# Patient Record
Sex: Female | Born: 1935 | Race: White | Hispanic: No | Smoking: Never smoker
Health system: Southern US, Community
[De-identification: ages and names within clinical notes are randomized; demographics above are authoritative.]

## PROBLEM LIST (undated history)

## (undated) DIAGNOSIS — F0390 Unspecified dementia without behavioral disturbance: Secondary | ICD-10-CM

## (undated) DIAGNOSIS — R413 Other amnesia: Secondary | ICD-10-CM

## (undated) DIAGNOSIS — F419 Anxiety disorder, unspecified: Secondary | ICD-10-CM

## (undated) DIAGNOSIS — J45909 Unspecified asthma, uncomplicated: Secondary | ICD-10-CM

## (undated) DIAGNOSIS — E559 Vitamin D deficiency, unspecified: Secondary | ICD-10-CM

## (undated) DIAGNOSIS — M810 Age-related osteoporosis without current pathological fracture: Secondary | ICD-10-CM

## (undated) DIAGNOSIS — N183 Chronic kidney disease, stage 3 unspecified: Secondary | ICD-10-CM

## (undated) DIAGNOSIS — C439 Malignant melanoma of skin, unspecified: Secondary | ICD-10-CM

## (undated) DIAGNOSIS — R1310 Dysphagia, unspecified: Secondary | ICD-10-CM

## (undated) DIAGNOSIS — H9190 Unspecified hearing loss, unspecified ear: Secondary | ICD-10-CM

## (undated) DIAGNOSIS — I10 Essential (primary) hypertension: Secondary | ICD-10-CM

## (undated) DIAGNOSIS — G3184 Mild cognitive impairment, so stated: Secondary | ICD-10-CM

## (undated) DIAGNOSIS — E785 Hyperlipidemia, unspecified: Secondary | ICD-10-CM

## (undated) HISTORY — DX: Age-related osteoporosis without current pathological fracture: M81.0

## (undated) HISTORY — DX: Mild cognitive impairment of uncertain or unknown etiology: G31.84

## (undated) HISTORY — DX: Anxiety disorder, unspecified: F41.9

## (undated) HISTORY — DX: Hyperlipidemia, unspecified: E78.5

## (undated) HISTORY — DX: Unspecified hearing loss, unspecified ear: H91.90

## (undated) HISTORY — DX: Unspecified asthma, uncomplicated: J45.909

## (undated) HISTORY — PX: CATARACT EXTRACTION: SUR2

## (undated) HISTORY — DX: Unspecified dementia, unspecified severity, without behavioral disturbance, psychotic disturbance, mood disturbance, and anxiety: F03.90

## (undated) HISTORY — DX: Chronic kidney disease, stage 3 unspecified: N18.30

## (undated) HISTORY — DX: Vitamin D deficiency, unspecified: E55.9

## (undated) HISTORY — DX: Dysphagia, unspecified: R13.10

## (undated) HISTORY — PX: KNEE SURGERY: SHX244

## (undated) HISTORY — DX: Other amnesia: R41.3

## (undated) HISTORY — DX: Essential (primary) hypertension: I10

## (undated) HISTORY — PX: APPENDECTOMY: SHX54

## (undated) HISTORY — DX: Malignant melanoma of skin, unspecified: C43.9

---

## 1998-06-24 ENCOUNTER — Other Ambulatory Visit: Admission: RE | Admit: 1998-06-24 | Discharge: 1998-06-24 | Payer: Self-pay | Admitting: Family Medicine

## 1999-06-07 ENCOUNTER — Other Ambulatory Visit: Admission: RE | Admit: 1999-06-07 | Discharge: 1999-06-07 | Payer: Self-pay | Admitting: Family Medicine

## 1999-06-30 ENCOUNTER — Encounter: Admission: RE | Admit: 1999-06-30 | Discharge: 1999-06-30 | Payer: Self-pay | Admitting: Family Medicine

## 1999-06-30 ENCOUNTER — Encounter: Payer: Self-pay | Admitting: Family Medicine

## 2000-08-08 ENCOUNTER — Encounter: Payer: Self-pay | Admitting: Family Medicine

## 2000-08-08 ENCOUNTER — Encounter: Admission: RE | Admit: 2000-08-08 | Discharge: 2000-08-08 | Payer: Self-pay | Admitting: Family Medicine

## 2001-08-13 ENCOUNTER — Encounter: Admission: RE | Admit: 2001-08-13 | Discharge: 2001-08-13 | Payer: Self-pay | Admitting: Family Medicine

## 2001-08-13 ENCOUNTER — Encounter: Payer: Self-pay | Admitting: Family Medicine

## 2001-08-29 ENCOUNTER — Other Ambulatory Visit: Admission: RE | Admit: 2001-08-29 | Discharge: 2001-08-29 | Payer: Self-pay | Admitting: Family Medicine

## 2002-12-23 ENCOUNTER — Encounter: Payer: Self-pay | Admitting: Family Medicine

## 2002-12-23 ENCOUNTER — Encounter: Admission: RE | Admit: 2002-12-23 | Discharge: 2002-12-23 | Payer: Self-pay | Admitting: Family Medicine

## 2002-12-25 ENCOUNTER — Encounter: Payer: Self-pay | Admitting: Family Medicine

## 2002-12-25 ENCOUNTER — Encounter: Admission: RE | Admit: 2002-12-25 | Discharge: 2002-12-25 | Payer: Self-pay | Admitting: Family Medicine

## 2004-01-19 ENCOUNTER — Encounter: Admission: RE | Admit: 2004-01-19 | Discharge: 2004-01-19 | Payer: Self-pay | Admitting: Family Medicine

## 2005-01-20 ENCOUNTER — Encounter: Admission: RE | Admit: 2005-01-20 | Discharge: 2005-01-20 | Payer: Self-pay | Admitting: Family Medicine

## 2006-01-22 ENCOUNTER — Encounter: Admission: RE | Admit: 2006-01-22 | Discharge: 2006-01-22 | Payer: Self-pay | Admitting: Family Medicine

## 2007-02-12 ENCOUNTER — Encounter: Admission: RE | Admit: 2007-02-12 | Discharge: 2007-02-12 | Payer: Self-pay | Admitting: Family Medicine

## 2008-02-21 ENCOUNTER — Encounter: Admission: RE | Admit: 2008-02-21 | Discharge: 2008-02-21 | Payer: Self-pay | Admitting: Family Medicine

## 2008-08-13 ENCOUNTER — Encounter: Admission: RE | Admit: 2008-08-13 | Discharge: 2008-08-13 | Payer: Self-pay | Admitting: Family Medicine

## 2009-05-13 ENCOUNTER — Encounter: Admission: RE | Admit: 2009-05-13 | Discharge: 2009-05-13 | Payer: Self-pay | Admitting: Family Medicine

## 2009-11-27 ENCOUNTER — Emergency Department (HOSPITAL_COMMUNITY): Admission: EM | Admit: 2009-11-27 | Discharge: 2009-11-27 | Payer: Self-pay | Admitting: Emergency Medicine

## 2011-06-07 ENCOUNTER — Other Ambulatory Visit: Payer: Self-pay | Admitting: Family Medicine

## 2011-06-07 ENCOUNTER — Ambulatory Visit
Admission: RE | Admit: 2011-06-07 | Discharge: 2011-06-07 | Disposition: A | Discharge status: Home / Self Care | Payer: Medicare Other | Source: Ambulatory Visit | Attending: Family Medicine | Admitting: Family Medicine

## 2011-06-07 DIAGNOSIS — J209 Acute bronchitis, unspecified: Secondary | ICD-10-CM

## 2011-07-10 ENCOUNTER — Other Ambulatory Visit: Payer: Self-pay | Admitting: Family Medicine

## 2011-07-10 ENCOUNTER — Ambulatory Visit
Admission: RE | Admit: 2011-07-10 | Discharge: 2011-07-10 | Disposition: A | Discharge status: Home / Self Care | Payer: Medicare Other | Source: Ambulatory Visit | Attending: Family Medicine | Admitting: Family Medicine

## 2011-07-10 DIAGNOSIS — W19XXXA Unspecified fall, initial encounter: Secondary | ICD-10-CM

## 2011-07-10 DIAGNOSIS — M25442 Effusion, left hand: Secondary | ICD-10-CM

## 2011-07-10 DIAGNOSIS — S60222A Contusion of left hand, initial encounter: Secondary | ICD-10-CM

## 2012-08-01 ENCOUNTER — Ambulatory Visit
Admission: RE | Admit: 2012-08-01 | Discharge: 2012-08-01 | Disposition: A | Discharge status: Home / Self Care | Payer: Medicare Other | Source: Ambulatory Visit | Attending: Family Medicine | Admitting: Family Medicine

## 2012-08-01 ENCOUNTER — Other Ambulatory Visit: Payer: Self-pay | Admitting: Family Medicine

## 2012-08-01 DIAGNOSIS — R0602 Shortness of breath: Secondary | ICD-10-CM

## 2014-04-07 ENCOUNTER — Other Ambulatory Visit: Payer: Self-pay

## 2014-04-07 ENCOUNTER — Other Ambulatory Visit: Payer: Self-pay | Admitting: Family Medicine

## 2014-04-07 DIAGNOSIS — Z1231 Encounter for screening mammogram for malignant neoplasm of breast: Secondary | ICD-10-CM

## 2014-04-16 ENCOUNTER — Ambulatory Visit
Admission: RE | Admit: 2014-04-16 | Discharge: 2014-04-16 | Disposition: A | Discharge status: Home / Self Care | Payer: Medicare Other | Source: Ambulatory Visit | Attending: Family Medicine | Admitting: Family Medicine

## 2014-04-16 DIAGNOSIS — Z1231 Encounter for screening mammogram for malignant neoplasm of breast: Secondary | ICD-10-CM

## 2014-07-21 ENCOUNTER — Encounter: Payer: Self-pay | Admitting: Neurology

## 2014-07-21 ENCOUNTER — Ambulatory Visit (INDEPENDENT_AMBULATORY_CARE_PROVIDER_SITE_OTHER): Payer: Medicare Other | Admitting: Neurology

## 2014-07-21 ENCOUNTER — Telehealth: Payer: Self-pay | Admitting: Family Medicine

## 2014-07-21 VITALS — BP 120/72 | HR 74 | Resp 16 | Wt 126.0 lb

## 2014-07-21 DIAGNOSIS — E785 Hyperlipidemia, unspecified: Secondary | ICD-10-CM | POA: Diagnosis not present

## 2014-07-21 DIAGNOSIS — F329 Major depressive disorder, single episode, unspecified: Secondary | ICD-10-CM

## 2014-07-21 DIAGNOSIS — R413 Other amnesia: Secondary | ICD-10-CM | POA: Insufficient documentation

## 2014-07-21 DIAGNOSIS — I10 Essential (primary) hypertension: Secondary | ICD-10-CM

## 2014-07-21 DIAGNOSIS — F32A Depression, unspecified: Secondary | ICD-10-CM

## 2014-07-21 MED ORDER — DONEPEZIL HCL 5 MG PO TABS: ORAL_TABLET | ORAL | Status: DC

## 2014-07-21 NOTE — Patient Instructions (Addendum)
1. Schedule MRI brain without contrast 2. We will obtain bloodwork done by your PCP, if TSH and vitamin B12 not done, we will order that 3. Start Aricept 5mg  daily 4. Physical exercise and brain stimulation exercises are important for brain health 5. Follow-up in 3 months

## 2014-07-21 NOTE — Telephone Encounter (Signed)
Called patient to give her appt info for her MRI Brain. She is scheduled at [facility] on 07/28/12 @ 1:00 pm to arrive at 12:45. Scheduling number given to patient in case she needs to change her appt.

## 2014-07-21 NOTE — Progress Notes (Signed)
NEUROLOGY CONSULTATION NOTE  Kristen Mclaughlin MRN: [id number] DOB: 1932/06/15  Referring provider: Dr. Harlan Stains Primary care provider:  Dr. Harlan Stains  Reason for consult:  Memory issues, r/o dementia  Dear Dr Dema Severin:  Thank you for your kind referral of Kristen Mclaughlin for consultation of the above symptoms. Although her history is well known to you, please allow me to reiterate it for the purpose of our medical record. She is by herself in the office today. Records and images were personally reviewed where available.  HISTORY OF PRESENT ILLNESS: This is a 81 year old right-handed woman with a history of hypertension, hyperlipidemia, depression, presenting with worsening memory loss over the past year. She reports "I'm thinking it's leaving me." She forgets names, conversations, misplaces things frequently. She lives with her husband who has noticed the same things. She has gotten lost driving in an unfamiliar place, and has had to call her daughter a couple of times in the past year. She occasionally forgets to take her medications. She has occasional word-finding difficulties. She denies any missed bill payments, no difficulties with ADLs. She endorses going through a lot, with stress, depression, and anxiety.  She has frontal headaches attributed to tension occurring around once a week, no associated nausea, vomiting, photo/phonophobia. No dizziness, blurred/double vision, no dysarthria/dysphagia, focal numbness/tingling/weakness, no tremors, bowel/bladder dysfunction. She has chronic back pain. She reports she has never been able to smell good. Her mother and sister have a diagnosis of Alzheimer's disease. She denies any history of head injuries, no alcohol use. She had Bell's palsy twice, she cannot recall which side, but thinks it is on the left side).  Laboratory Data: 03/2014: BMP normal, vitamin D 48.5  PAST MEDICAL HISTORY: Past Medical History  Diagnosis Date  .  Hypertension   . Hyperlipidemia   . Asthma     PAST SURGICAL HISTORY: Past Surgical History  Procedure Laterality Date  . Knee surgery      right    MEDICATIONS: No current outpatient prescriptions on file prior to visit.   No current facility-administered medications on file prior to visit.    ALLERGIES: No Known Allergies  FAMILY HISTORY: Family History  Problem Relation Age of Onset  . Alzheimer's disease Mother   . Alzheimer's disease Sister   . Asthma Sister   . Asthma Mother     SOCIAL HISTORY: History   Social History  . Marital Status: Married    Spouse Name: N/A  . Number of Children: 2  . Years of Education: N/A   Occupational History  . Retired    Social History Main Topics  . Smoking status: Never Smoker   . Smokeless tobacco: Never Used  . Alcohol Use: No  . Drug Use: No  . Sexual Activity: Not on file   Other Topics Concern  . Not on file   Social History Narrative    REVIEW OF SYSTEMS: Constitutional: No fevers, chills, or sweats, no generalized fatigue, change in appetite Eyes: No visual changes, double vision, eye pain Ear, nose and throat: No hearing loss, ear pain, nasal congestion, sore throat Cardiovascular: No chest pain, palpitations Respiratory:  No shortness of breath at rest or with exertion, wheezes GastrointestinaI: No nausea, vomiting, diarrhea, abdominal pain, fecal incontinence Genitourinary:  No dysuria, urinary retention or frequency Musculoskeletal:  No neck pain, back pain Integumentary: No rash, pruritus, skin lesions Neurological: as above Psychiatric: No depression, insomnia, anxiety Endocrine: No palpitations, fatigue, diaphoresis, mood swings, change in  appetite, change in weight, increased thirst Hematologic/Lymphatic:  No anemia, purpura, petechiae. Allergic/Immunologic: no itchy/runny eyes, nasal congestion, recent allergic reactions, rashes  PHYSICAL EXAM: Filed Vitals:   07/21/14 0859  BP: 120/72    Pulse: 74  Resp: 16   General: No acute distress Head:  Normocephalic/atraumatic Eyes: Fundoscopic exam shows bilateral sharp discs, no vessel changes, exudates, or hemorrhages Neck: supple, no paraspinal tenderness, full range of motion Back: No paraspinal tenderness Heart: regular rate and rhythm Lungs: Clear to auscultation bilaterally. Vascular: No carotid bruits. Skin/Extremities: No rash, no edema Neurological Exam: Mental status: alert and oriented to person, place, and time, no dysarthria or aphasia, Fund of knowledge is appropriate.  Recent and remote memory are intact.  Attention and concentration are normal.    Able to name objects and repeat phrases.  Montreal Cognitive Assessment  07/21/2014  Visuospatial/ Executive (0/5) 3  Naming (0/3) 3  Attention: Read list of digits (0/2) 1  Attention: Read list of letters (0/1) 1  Attention: Serial 7 subtraction starting at 100 (0/3) 3  Language: Repeat phrase (0/2) 2  Language : Fluency (0/1) 0  Abstraction (0/2) 2  Delayed Recall (0/5) 4  Orientation (0/6) 6  Total 25  Adjusted Score (based on education) 25   Cranial nerves: CN I: not tested CN II: pupils equal, round and reactive to light, visual fields intact, fundi unremarkable. CN III, IV, VI:  full range of motion, no nystagmus, no ptosis CN V: facial sensation intact CN VII: upper and lower face symmetric but note of decreased blink on the left with synkinesis CN VIII: hearing intact to finger rub CN IX, X: gag intact, uvula midline CN XI: sternocleidomastoid and trapezius muscles intact CN XII: tongue midline Bulk & Tone: normal, no fasciculations. Motor: 5/5 throughout with no pronator drift. Sensation: decreased vibration to ankles bilaterally, otherwise intact to light touch, cold, pin, and joint position sense.  No extinction to double simultaneous stimulation.  Romberg test negative Deep Tendon Reflexes: +2 throughout, no ankle clonus Plantar responses:  downgoing bilaterally Cerebellar: no incoordination on finger to nose, heel to shin. No dysdiadochokinesia Gait: narrow-based and steady, able to tandem walk adequately. Tremor: none  IMPRESSION: This is a 81 year old righ-handed woman with a history of hypertension, hyperlipidemia, depression, presenting with worsening memory over the past year. Her MOCA score today is 25/30, indicating mild cognitive impairment. We discussed different causes of memory loss. Check TSH and B12. MRI brain without contrast will be ordered to assess for underlying structural abnormality and assess vascular load. We also discussed effects of mood on memory, she endorses a lot of stress and anxiety. Continue to monitor. We discussed that he may benefit from starting cholinesterase inhibitors such as Aricept, side effects and expectations from the medication were discussed. She will start low dose Aricept 5 mg daily. We discussed the importance of control of vascular risk factors, physical exercise, and brain stimulation exercises for brain health. She will follow-up in 3 months.   Thank you for allowing me to participate in the care of this patient. Please do not hesitate to call for any questions or concerns.   Ellouise Newer, M.D.  CC: Dr. Dema Severin

## 2014-07-24 ENCOUNTER — Telehealth: Payer: Self-pay | Admitting: Family Medicine

## 2014-07-24 DIAGNOSIS — F32A Depression, unspecified: Secondary | ICD-10-CM | POA: Insufficient documentation

## 2014-07-24 DIAGNOSIS — E785 Hyperlipidemia, unspecified: Secondary | ICD-10-CM | POA: Insufficient documentation

## 2014-07-24 DIAGNOSIS — F329 Major depressive disorder, single episode, unspecified: Secondary | ICD-10-CM | POA: Insufficient documentation

## 2014-07-24 DIAGNOSIS — I10 Essential (primary) hypertension: Secondary | ICD-10-CM | POA: Insufficient documentation

## 2014-07-24 DIAGNOSIS — R413 Other amnesia: Secondary | ICD-10-CM

## 2014-07-24 NOTE — Telephone Encounter (Signed)
Called patient and notified of below. She will come to have labs done sometime next week. Orders put in Epic and printed left up front for p/u to take to [facility].

## 2014-07-24 NOTE — Telephone Encounter (Signed)
-----   Message from Cameron Sprang, MD sent at 07/24/2014  3:29 PM EDT ----- Regarding: labs Pls let her know we received bloodwork from Dr. Dema Severin, would do TSH and B12, pls send lab slip. Thanks

## 2014-07-28 LAB — VITAMIN B12: Vitamin B-12: 346 pg/mL (ref 211–911)

## 2014-07-28 LAB — TSH: TSH: 2.088 u[IU]/mL (ref 0.350–4.500)

## 2014-07-29 ENCOUNTER — Ambulatory Visit (HOSPITAL_COMMUNITY)
Admission: RE | Admit: 2014-07-29 | Discharge: 2014-07-29 | Disposition: A | Discharge status: Home / Self Care | Payer: Medicare Other | Source: Ambulatory Visit | Attending: Neurology | Admitting: Neurology

## 2014-07-29 DIAGNOSIS — R413 Other amnesia: Secondary | ICD-10-CM | POA: Insufficient documentation

## 2014-07-30 ENCOUNTER — Telehealth: Payer: Self-pay | Admitting: Family Medicine

## 2014-07-30 NOTE — Telephone Encounter (Signed)
Patient notified of MRI & lab results.              Pls let her know bloodwork is normal, thanks

## 2014-07-30 NOTE — Telephone Encounter (Signed)
-----   Message from Cameron Sprang, MD sent at 07/29/2014  3:14 PM EDT ----- Pls let patient know I reviewed MRI brain, no evidence of tumor, stroke, or bleed. It shows age-related changes. Thanks

## 2014-10-20 ENCOUNTER — Ambulatory Visit: Payer: Medicare Other | Admitting: Neurology

## 2014-10-22 ENCOUNTER — Encounter: Payer: Self-pay | Admitting: *Deleted

## 2015-08-14 ENCOUNTER — Other Ambulatory Visit: Payer: Self-pay | Admitting: Neurology

## 2015-08-16 NOTE — Telephone Encounter (Signed)
Pt last seen 4/16. No refill to be given.

## 2015-11-01 ENCOUNTER — Encounter: Payer: Self-pay | Admitting: Neurology

## 2015-11-01 ENCOUNTER — Ambulatory Visit (INDEPENDENT_AMBULATORY_CARE_PROVIDER_SITE_OTHER): Payer: Medicare Other | Admitting: Neurology

## 2015-11-01 VITALS — BP 140/66 | HR 72 | Ht 61.0 in | Wt 131.0 lb

## 2015-11-01 DIAGNOSIS — G3184 Mild cognitive impairment, so stated: Secondary | ICD-10-CM | POA: Diagnosis not present

## 2015-11-01 MED ORDER — DONEPEZIL HCL 10 MG PO TABS: ORAL_TABLET | ORAL | 11 refills | Status: DC

## 2015-11-01 NOTE — Patient Instructions (Signed)
1. Start Aricept 10mg : Take 1/2 tablet daily for 1 month, then increase to 1 tablet daily 2. Discuss mood issues with PCP, consider seeing a psychiatrist and therapist 3. Physical exercise and brain stimulation exercises are important for brain health 4. Follow-up in 6 months, call for any changes

## 2015-11-01 NOTE — Progress Notes (Signed)
NEUROLOGY FOLLOW UP OFFICE NOTE  TAMICHA TOWSLEY PX:[id number]  HISTORY OF PRESENT ILLNESS: I had the pleasure of seeing Kristen Mclaughlin in follow-up in the neurology clinic on 11/01/2015.  The patient was last seen more than a year ago for mild cognitive impairment. She is accompanied by her daughter who helps supplement the history today.  MOCA score 25/30 on initial visit in April 2016. She was started on low dose Aricept 5mg  daily and was only taking this intermittently. She felt it was too strong or not working, unclear to determine, but essentially stopped the medication. She denied any clear side effects. I personally reviewed MRI brain without contrast done 07/2014 which did not show any acute changes, there was moderate atrophy and chronic microvascular disease. TSH and B12 normal.  Since her last visit, they report that she got lost driving twice. For the past couple of months, she has only been driving short distances locally without any difficulties. She lives with her husband and has missed some bill payments. She denies any missed medications. Her daughter notices she repeats herself. She would call her daughter reporting her phone is not ringing, not realizing she turned it on vibrate mode during church. She does this every week. They have noticed significant worsening in the past 4-5 months. She reports undergoing stress, anxiety, and depression, worse in the past few months. She denies any significant headaches, no dizziness, vision changes, focal numbness/tingling/weakness, bowel/bladder dysfunction. She has chronic back pain.   HPI April 2016: This is an 82 yo RH woman with a history of hypertension, hyperlipidemia, depression, with worsening memory loss over the past year. She reports "I'm thinking it's leaving me." She forgets names, conversations, misplaces things frequently. She lives with her husband who has noticed the same things. She has gotten lost driving in an unfamiliar place,  and has had to call her daughter a couple of times in the past year. She occasionally forgets to take her medications. She has occasional word-finding difficulties. She denies any missed bill payments, no difficulties with ADLs. She endorses going through a lot, with stress, depression, and anxiety.  Her mother and sister have a diagnosis of Alzheimer's disease. She denies any history of head injuries, no alcohol use. She had Bell's palsy twice, she cannot recall which side, but thinks it is on the left side).  PAST MEDICAL HISTORY: Past Medical History:  Diagnosis Date  . Asthma   . Hyperlipidemia   . Hypertension     MEDICATIONS: Current Outpatient Prescriptions on File Prior to Visit  Medication Sig Dispense Refill  . albuterol (PROVENTIL HFA;VENTOLIN HFA) 108 (90 BASE) MCG/ACT inhaler Inhale 1 puff into the lungs. As Needed    . aspirin 81 MG tablet Take 81 mg by mouth daily.    . Coenzyme Q10 (CO Q-10) 200 MG CAPS Take 200 mg by mouth daily.    . diclofenac sodium (VOLTAREN) 1 % GEL Apply topically. As Needed    . donepezil (ARICEPT) 5 MG tablet Take 1 tablet daily 30 tablet 6  . losartan-hydrochlorothiazide (HYZAAR) 100-25 MG per tablet Take 1 tablet by mouth daily.    . sertraline (ZOLOFT) 100 MG tablet Take 100 mg by mouth daily.    . simvastatin (ZOCOR) 40 MG tablet Take 40 mg by mouth daily.    . Vitamin D, Ergocalciferol, (DRISDOL) 50000 UNITS CAPS capsule Take 50,000 Units by mouth. 1 capsule 1-2 times a week    . vitamin E 400 UNIT capsule Take 400  Units by mouth daily.     No current facility-administered medications on file prior to visit.     ALLERGIES: No Known Allergies  FAMILY HISTORY: Family History  Problem Relation Age of Onset  . Alzheimer's disease Mother   . Alzheimer's disease Sister   . Asthma Sister   . Asthma Mother     SOCIAL HISTORY: Social History   Social History  . Marital status: Married    Spouse name: N/A  . Number of children: 2    . Years of education: N/A   Occupational History  . Retired    Social History Main Topics  . Smoking status: Never Smoker  . Smokeless tobacco: Never Used  . Alcohol use No  . Drug use: No  . Sexual activity: Not on file   Other Topics Concern  . Not on file   Social History Narrative  . No narrative on file    REVIEW OF SYSTEMS: Constitutional: No fevers, chills, or sweats, no generalized fatigue, change in appetite Eyes: No visual changes, double vision, eye pain Ear, nose and throat: No hearing loss, ear pain, nasal congestion, sore throat Cardiovascular: No chest pain, palpitations Respiratory:  No shortness of breath at rest or with exertion, wheezes GastrointestinaI: No nausea, vomiting, diarrhea, abdominal pain, fecal incontinence Genitourinary:  No dysuria, urinary retention or frequency Musculoskeletal:  No neck pain,+ back pain Integumentary: No rash, pruritus, skin lesions Neurological: as above Psychiatric: No depression, insomnia, anxiety Endocrine: No palpitations, fatigue, diaphoresis, mood swings, change in appetite, change in weight, increased thirst Hematologic/Lymphatic:  No anemia, purpura, petechiae. Allergic/Immunologic: no itchy/runny eyes, nasal congestion, recent allergic reactions, rashes  PHYSICAL EXAM: Vitals:   11/01/15 1543  BP: 140/66  Pulse: 72   General: No acute distress Head:  Normocephalic/atraumatic Neck: supple, no paraspinal tenderness, full range of motion Heart:  Regular rate and rhythm Lungs:  Clear to auscultation bilaterally Back: No paraspinal tenderness Skin/Extremities: No rash, no edema Neurological Exam: alert and oriented to person, place, and time. No aphasia or dysarthria. Fund of knowledge is appropriate.  Recent and remote memory are intact.  Attention and concentration are normal.    Able to name objects and repeat phrases.  Montreal Cognitive Assessment  11/01/2015 07/21/2014  Visuospatial/ Executive (0/5) 5 3   Naming (0/3) 3 3  Attention: Read list of digits (0/2) 2 1  Attention: Read list of letters (0/1) 1 1  Attention: Serial 7 subtraction starting at 100 (0/3) 3 3  Language: Repeat phrase (0/2) 2 2  Language : Fluency (0/1) 0 0  Abstraction (0/2) 2 2  Delayed Recall (0/5) 4 4  Orientation (0/6) 6 6  Total 28 25  Adjusted Score (based on education) 28 25   Cranial nerves: Pupils equal, round.  Extraocular movements intact with no nystagmus. No facial asymmetry. Motor:moves all extremities symmetrically.Gait narrow-based and steady.  IMPRESSION: This is a 82 yo RH woman with a history of hypertension, hyperlipidemia, depression, with worsening memory loss since her last visit. Her MOCA score today is better 28/30 (previously 25/30 in April 2016). We discussed resuming Aricept intake, she will start taking 5mg  daily for a month then increase to 10mg  daily. Side effects were discussed. We also discussed again effects of mood on memory, she endorses a lot of stress and anxiety, particularly in the past few months when memory symptoms worsened, and was encouraged to consider seeing Behavioral Medicine. We discussed the importance of control of vascular risk factors, physical exercise, and brain  stimulation exercises for brain health. We discussed driving, continue to monitor. If family continues to express concern, recommend a driving evaluation. She will follow-up in 6 months and knows to call for any changes.   Thank you for allowing me to participate in her care.  Please do not hesitate to call for any questions or concerns.  The duration of this appointment visit was 25 minutes of face-to-face time with the patient.  Greater than 50% of this time was spent in counseling, explanation of diagnosis, planning of further management, and coordination of care.   Ellouise Newer, M.D.   CC: Dr. Dema Severin

## 2015-12-17 ENCOUNTER — Telehealth: Payer: Self-pay | Admitting: Neurology

## 2015-12-17 NOTE — Telephone Encounter (Signed)
Have problems taking Donepezil.  Cause hic ups, acid reflex and constantly going to the bathroom (diarrhea) .  Please call pt, she would like to know if there is another med she could take without these side effects.

## 2015-12-17 NOTE — Telephone Encounter (Signed)
Any suggestions>?

## 2015-12-17 NOTE — Telephone Encounter (Signed)
Yes, stop the medication first and see how she does over the next 2 weeks. Call us back to make sure symptoms have gone away, then we will plan to start a different medication. Thanks

## 2015-12-17 NOTE — Telephone Encounter (Signed)
Pt notified, verbalized understanding.

## 2016-01-11 ENCOUNTER — Other Ambulatory Visit: Payer: Self-pay

## 2016-01-11 ENCOUNTER — Telehealth: Payer: Self-pay

## 2016-01-11 MED ORDER — RIVASTIGMINE TARTRATE 1.5 MG PO CAPS: ORAL_CAPSULE | ORAL | 3 refills | Status: DC

## 2016-01-11 NOTE — Telephone Encounter (Signed)
Patient called and states she had stopped taking Aricept and now wants to know if there is another medication she can try to take.

## 2016-01-11 NOTE — Telephone Encounter (Signed)
Start taking Exelon 1.5mg : Take 1 capsule at night for 1 week, then increase to 1 capsule twice a day. This is a very low dose, less likely to cause diarrhea. Call for any problems. Pls send for #30 with 3 refills, thanks

## 2016-01-11 NOTE — Telephone Encounter (Signed)
Left message with patient's husband of below and advised her to call if any questions or concerns.

## 2016-05-10 ENCOUNTER — Encounter: Payer: Self-pay | Admitting: Neurology

## 2016-05-10 ENCOUNTER — Ambulatory Visit (INDEPENDENT_AMBULATORY_CARE_PROVIDER_SITE_OTHER): Payer: Medicare Other | Admitting: Neurology

## 2016-05-10 VITALS — BP 140/82 | HR 83 | Ht 61.0 in | Wt 127.1 lb

## 2016-05-10 DIAGNOSIS — G3184 Mild cognitive impairment, so stated: Secondary | ICD-10-CM

## 2016-05-10 MED ORDER — RIVASTIGMINE TARTRATE 1.5 MG PO CAPS: ORAL_CAPSULE | ORAL | 6 refills | Status: DC

## 2016-05-10 NOTE — Patient Instructions (Signed)
1. Start Exelon 1.5mg : Take 1 capsule at night for 1 week, then increase to 1 capsule twice a day 2. Call our office in 3 months, as long as tolerating the medication, we will call in a higher dose 3. Control of blood pressure, cholesterol, as well as physical exercise and brain stimulation exercises are important for brain health 4. Follow-up in 6 months, call for any changes

## 2016-05-10 NOTE — Progress Notes (Signed)
NEUROLOGY FOLLOW UP OFFICE NOTE  CLEOPHA GEETER PX:[id number]  HISTORY OF PRESENT ILLNESS: I had the pleasure of seeing Kristen Mclaughlin in follow-up in the neurology clinic on 05/10/2016.  The patient was last seen 6 months ago for mild cognitive impairment. She is again accompanied by her daughter who helps supplement the history today.  MOCA score 28/30 in August 2017 (25/30 on initial visit in April 2016). She was started on Aricept but had diarrhea, her husband called to report this and she was switched to Exelon, but she presents today stating she had no knowledge that a different medication was called in several months ago. Her daughter felt that when she was on the Aricept, she was making sense of things better and that it was helping her. She was not asking the same questions repeatedly and she seemed to have more clarity. She could tell a difference when she stopped the medication. She drives only short distances now and states she is doing okay. She reports having headaches "all the time." She denies any falls but feels wobbly and has to hold on to things. She denies any focal numbness/tingling/weakness.   HPI April 2016: This is an 83 yo RH woman with a history of hypertension, hyperlipidemia, depression, with worsening memory loss over the past year. She reports "I'm thinking it's leaving me." She forgets names, conversations, misplaces things frequently. She lives with her husband who has noticed the same things. She has gotten lost driving in an unfamiliar place, and has had to call her daughter a couple of times in the past year. She occasionally forgets to take her medications. She has occasional word-finding difficulties. She denies any missed bill payments, no difficulties with ADLs. She endorses going through a lot, with stress, depression, and anxiety.  Her mother and sister have a diagnosis of Alzheimer's disease. She denies any history of head injuries, no alcohol use. She had  Bell's palsy twice, she cannot recall which side, but thinks it is on the left side).  Diagnostic Data: MRI brain without contrast done 07/2014 did not show any acute changes, there was moderate atrophy and chronic microvascular disease. TSH and B12 normal.  PAST MEDICAL HISTORY: Past Medical History:  Diagnosis Date  . Asthma   . Hyperlipidemia   . Hypertension     MEDICATIONS: Current Outpatient Prescriptions on File Prior to Visit  Medication Sig Dispense Refill  . albuterol (PROVENTIL HFA;VENTOLIN HFA) 108 (90 BASE) MCG/ACT inhaler Inhale 1 puff into the lungs. As Needed    . aspirin 81 MG tablet Take 81 mg by mouth daily.    . Coenzyme Q10 (CO Q-10) 200 MG CAPS Take 200 mg by mouth daily.    . diclofenac sodium (VOLTAREN) 1 % GEL Apply topically. As Needed    . fluticasone (FLONASE) 50 MCG/ACT nasal spray Place into both nostrils daily.    Marland Kitchen losartan-hydrochlorothiazide (HYZAAR) 100-25 MG per tablet Take 1 tablet by mouth daily.    . sertraline (ZOLOFT) 100 MG tablet Take 100 mg by mouth daily.    . simvastatin (ZOCOR) 40 MG tablet Take 40 mg by mouth daily.    . Vitamin D, Ergocalciferol, (DRISDOL) 50000 UNITS CAPS capsule Take 50,000 Units by mouth. 1 capsule 1-2 times a week    . vitamin E 400 UNIT capsule Take 400 Units by mouth daily.    . rivastigmine (EXELON) 1.5 MG capsule Take 1 capsule at night for a week, then increase to 1 capsule twice a day. (  Patient not taking: Reported on 05/10/2016) 30 capsule 3   No current facility-administered medications on file prior to visit.     ALLERGIES: No Known Allergies  FAMILY HISTORY: Family History  Problem Relation Age of Onset  . Alzheimer's disease Mother   . Asthma Mother   . Alzheimer's disease Sister   . Asthma Sister     SOCIAL HISTORY: Social History   Social History  . Marital status: Married    Spouse name: N/A  . Number of children: 2  . Years of education: N/A   Occupational History  . Retired     Social History Main Topics  . Smoking status: Never Smoker  . Smokeless tobacco: Never Used  . Alcohol use No  . Drug use: No  . Sexual activity: Not on file   Other Topics Concern  . Not on file   Social History Narrative  . No narrative on file    REVIEW OF SYSTEMS: Constitutional: No fevers, chills, or sweats, no generalized fatigue, change in appetite Eyes: No visual changes, double vision, eye pain Ear, nose and throat: No hearing loss, ear pain, nasal congestion, sore throat Cardiovascular: No chest pain, palpitations Respiratory:  No shortness of breath at rest or with exertion, wheezes GastrointestinaI: No nausea, vomiting, diarrhea, abdominal pain, fecal incontinence Genitourinary:  No dysuria, urinary retention or frequency Musculoskeletal:  No neck pain,+ back pain Integumentary: No rash, pruritus, skin lesions Neurological: as above Psychiatric: No depression, insomnia, anxiety Endocrine: No palpitations, fatigue, diaphoresis, mood swings, change in appetite, change in weight, increased thirst Hematologic/Lymphatic:  No anemia, purpura, petechiae. Allergic/Immunologic: no itchy/runny eyes, nasal congestion, recent allergic reactions, rashes  PHYSICAL EXAM: Vitals:   05/10/16 1551  BP: 140/82  Pulse: 83   General: No acute distress Head:  Normocephalic/atraumatic Neck: supple, no paraspinal tenderness, full range of motion Heart:  Regular rate and rhythm Lungs:  Clear to auscultation bilaterally Back: No paraspinal tenderness Skin/Extremities: No rash, no edema Neurological Exam: alert and oriented to person, place, and time. No aphasia or dysarthria. Fund of knowledge is appropriate.  Recent and remote memory are intact.  Attention and concentration are normal.    Able to name objects and repeat phrases. CDT 5/5 MMSE - Mini Mental State Exam 05/10/2016  Orientation to time 5  Orientation to Place 5  Registration 3  Attention/ Calculation 5  Recall 2   Language- name 2 objects 2  Language- repeat 1  Language- follow 3 step command 3  Language- read & follow direction 1  Write a sentence 1  Copy design 1  Total score 29   Cranial nerves: Pupils equal, round.  Extraocular movements intact with no nystagmus. No facial asymmetry. Motor:moves all extremities symmetrically.Gait narrow-based and steady.  IMPRESSION: This is a 83 yo RH woman with a history of hypertension, hyperlipidemia, depression, with worsening memory loss since her last visit. Her MMSE today is 29/30 (MOCA  28/30 in August 2017, 25/30 in April 2016). Symptoms suggestive of mild cognitive impairment, possible mild dementia. She had side effects on Aricept but did not start the Exelon. Side effects were discussed, she will start low dose Exelon 1.5mg  qhs x 1 week, then increase to 1 cap BID. We will further uptitrate in the next few months if no side effects. She may benefit from the patch if she continues to have GI symptoms. We again discussed the importance of control of vascular risk factors, physical exercise, and brain stimulation exercises for brain health. We discussed  driving, continue to monitor. She will follow-up in 6 months and knows to call for any changes.   Thank you for allowing me to participate in her care.  Please do not hesitate to call for any questions or concerns.  The duration of this appointment visit was 25 minutes of face-to-face time with the patient.  Greater than 50% of this time was spent in counseling, explanation of diagnosis, planning of further management, and coordination of care.   Ellouise Newer, M.D.   CC: Dr. Dema Severin

## 2016-05-12 ENCOUNTER — Encounter: Payer: Self-pay | Admitting: Neurology

## 2016-08-24 ENCOUNTER — Telehealth: Payer: Self-pay | Admitting: Neurology

## 2016-08-24 NOTE — Telephone Encounter (Signed)
Caller: Pamala Hurry  Urgent? No  Reason for the call: Left message regarding needing Dr. Delice Lesch to make changes with her medication. Please call. Thanks

## 2016-08-24 NOTE — Telephone Encounter (Signed)
Spoke with pt.  She states that the Exelon is not working for her and would like to switch back to Aricept.  She states that she is still getting confused and forgets where she is going sometimes.  I let her know that I will pass a message to Dr. Delice Lesch and that I will call her back with providers advise.  I also told her to continue taking her Exelon as directed until she hears from [place].

## 2016-08-24 NOTE — Telephone Encounter (Signed)
Let's start with lower dose Aricept since she had side effects in the past. Stop Exelon, then start Aricept 5mg  daily. Thanks

## 2016-08-25 ENCOUNTER — Other Ambulatory Visit: Payer: Self-pay

## 2016-08-25 MED ORDER — DONEPEZIL HCL 5 MG PO TABS: 5.0000 mg | ORAL_TABLET | Freq: Every day | ORAL | 6 refills | Status: DC

## 2016-08-25 NOTE — Telephone Encounter (Signed)
LMOM letting pt know that I have sent an Rx for Aricept 5mg  to the [facility] on the [address] of [address] in [place], [place].  Also stated that pt is to stop taking the Exelon when starts taking the Aricept.

## 2016-10-16 ENCOUNTER — Other Ambulatory Visit: Payer: Self-pay | Admitting: Family Medicine

## 2016-10-16 ENCOUNTER — Ambulatory Visit
Admission: RE | Admit: 2016-10-16 | Discharge: 2016-10-16 | Disposition: A | Discharge status: Home / Self Care | Payer: Medicare Other | Source: Ambulatory Visit | Attending: Family Medicine | Admitting: Family Medicine

## 2016-10-16 DIAGNOSIS — R066 Hiccough: Secondary | ICD-10-CM

## 2016-11-13 ENCOUNTER — Encounter: Payer: Self-pay | Admitting: Neurology

## 2016-11-13 ENCOUNTER — Ambulatory Visit (INDEPENDENT_AMBULATORY_CARE_PROVIDER_SITE_OTHER): Payer: Medicare Other | Admitting: Neurology

## 2016-11-13 VITALS — BP 138/60 | HR 73 | Ht 61.0 in | Wt 127.0 lb

## 2016-11-13 DIAGNOSIS — G3184 Mild cognitive impairment, so stated: Secondary | ICD-10-CM

## 2016-11-13 MED ORDER — RIVASTIGMINE TARTRATE 1.5 MG PO CAPS: ORAL_CAPSULE | ORAL | 3 refills | Status: DC

## 2016-11-13 NOTE — Patient Instructions (Signed)
1. Continue Exelon 1.5mg  twice a day 2. Continue control of blood pressure, cholesterol, as well as physical exercise and brain stimulation exercises for brain health 3. Follow-up in 6 months, call for any changes

## 2016-11-13 NOTE — Progress Notes (Signed)
NEUROLOGY FOLLOW UP OFFICE NOTE  Kristen Mclaughlin [id number]  HISTORY OF PRESENT ILLNESS: I had the pleasure of seeing Kristen Mclaughlin in follow-up in the neurology clinic on 11/13/2016.  The patient was last seen 6 months ago for mild cognitive impairment, possible mild dementia. She is again accompanied by her daughter who helps supplement the history today. MMSE 29/30 in February 2018 ([place] score 28/30 in August 2017, 25/30 on initial visit in April 2016). She had been switched to Exelon on her last visit due to report of diarrhea, then she called our office last May to report that Exelon was not working for her and wanted to switch back to Aricept. She was still getting confused and forget where she was going at times. She presents today stating that she did not fill the Aricept and continues on Exelon 1.5mg  BID without any side effects. She reports that her daughter tells her that her memory is better, but she does not know, she keeps forgetting things. She does not drive much, only to church, and denies getting lost. She denies any missed bills or missing medications. She has headaches "all the time" and a lot of back pain. She denies any dizziness, diplopia, focal numbness/tingling/weakness, no falls.   HPI April 2016: This is an 83 yo RH woman with a history of hypertension, hyperlipidemia, depression, with worsening memory loss over the past year. She reports "I'm thinking it's leaving me." She forgets names, conversations, misplaces things frequently. She lives with her husband who has noticed the same things. She has gotten lost driving in an unfamiliar place, and has had to call her daughter a couple of times in the past year. She occasionally forgets to take her medications. She has occasional word-finding difficulties. She denies any missed bill payments, no difficulties with ADLs. She endorses going through a lot, with stress, depression, and anxiety.  Her mother and sister have a  diagnosis of Alzheimer's disease. She denies any history of head injuries, no alcohol use. She had Bell's palsy twice, she cannot recall which side, but thinks it is on the left side).  Diagnostic Data: MRI brain without contrast done 07/2014 did not show any acute changes, there was moderate atrophy and chronic microvascular disease. TSH and B12 normal.  PAST MEDICAL HISTORY: Past Medical History:  Diagnosis Date  . Asthma   . Hyperlipidemia   . Hypertension     MEDICATIONS: Current Outpatient Prescriptions on File Prior to Visit  Medication Sig Dispense Refill  . albuterol (PROVENTIL HFA;VENTOLIN HFA) 108 (90 BASE) MCG/ACT inhaler Inhale 1 puff into the lungs. As Needed    . aspirin 81 MG tablet Take 81 mg by mouth daily.    . Coenzyme Q10 (CO Q-10) 200 MG CAPS Take 200 mg by mouth daily.    . diclofenac sodium (VOLTAREN) 1 % GEL Apply topically. As Needed    . donepezil (ARICEPT) 5 MG tablet Take 1 tablet (5 mg total) by mouth daily. 30 tablet 6  . fluticasone (FLONASE) 50 MCG/ACT nasal spray Place into both nostrils daily.    Marland Kitchen losartan-hydrochlorothiazide (HYZAAR) 100-25 MG per tablet Take 1 tablet by mouth daily.    . rivastigmine (EXELON) 1.5 MG capsule Take 1 capsule at night for a week, then increase to 1 capsule twice a day. 60 capsule 6  . sertraline (ZOLOFT) 100 MG tablet Take 100 mg by mouth daily.    . simvastatin (ZOCOR) 40 MG tablet Take 40 mg by mouth daily.    Marland Kitchen  Vitamin D, Ergocalciferol, (DRISDOL) 50000 UNITS CAPS capsule Take 50,000 Units by mouth. 1 capsule 1-2 times a week    . vitamin E 400 UNIT capsule Take 400 Units by mouth daily.     No current facility-administered medications on file prior to visit.     ALLERGIES: No Known Allergies  FAMILY HISTORY: Family History  Problem Relation Age of Onset  . Alzheimer's disease Mother   . Asthma Mother   . Alzheimer's disease Sister   . Asthma Sister     SOCIAL HISTORY: Social History   Social History    . Marital status: Married    Spouse name: N/A  . Number of children: 2  . Years of education: N/A   Occupational History  . Retired    Social History Main Topics  . Smoking status: Never Smoker  . Smokeless tobacco: Never Used  . Alcohol use No  . Drug use: No  . Sexual activity: Not on file   Other Topics Concern  . Not on file   Social History Narrative  . No narrative on file    REVIEW OF SYSTEMS: Constitutional: No fevers, chills, or sweats, no generalized fatigue, change in appetite Eyes: No visual changes, double vision, eye pain Ear, nose and throat: No hearing loss, ear pain, nasal congestion, sore throat Cardiovascular: No chest pain, palpitations Respiratory:  No shortness of breath at rest or with exertion, wheezes GastrointestinaI: No nausea, vomiting, diarrhea, abdominal pain, fecal incontinence Genitourinary:  No dysuria, urinary retention or frequency Musculoskeletal:  No neck pain,+ back pain Integumentary: No rash, pruritus, skin lesions Neurological: as above Psychiatric: No depression, insomnia, anxiety Endocrine: No palpitations, fatigue, diaphoresis, mood swings, change in appetite, change in weight, increased thirst Hematologic/Lymphatic:  No anemia, purpura, petechiae. Allergic/Immunologic: no itchy/runny eyes, nasal congestion, recent allergic reactions, rashes  PHYSICAL EXAM: Vitals:   11/13/16 1537  BP: 138/60  Pulse: 73  SpO2: 94%   General: No acute distress Head:  Normocephalic/atraumatic Neck: supple, no paraspinal tenderness, full range of motion Heart:  Regular rate and rhythm Lungs:  Clear to auscultation bilaterally Back: No paraspinal tenderness Skin/Extremities: No rash, no edema Neurological Exam: alert and oriented to person, place, and time. No aphasia or dysarthria. Fund of knowledge is appropriate.  Recent and remote memory are intact.  Attention and concentration are normal.    Able to name objects and repeat phrases. CDT  5/5 MMSE - Mini Mental State Exam 11/15/2016 05/10/2016  Orientation to time 4 5  Orientation to Place 5 5  Registration 3 3  Attention/ Calculation 5 5  Recall 2 2  Language- name 2 objects 2 2  Language- repeat 1 1  Language- follow 3 step command 3 3  Language- read & follow direction 1 1  Write a sentence 1 1  Copy design 1 1  Total score 28 29   Cranial nerves: Pupils equal, round.  Extraocular movements intact with no nystagmus. No facial asymmetry. Motor: 5/5 throughout with no pronator drift. Sensation intact to light touch. No incoordination on finger to nose testing. Gait narrow-based and steady, able to tandem walk adequately.  IMPRESSION: This is a 83 yo RH woman with a history of hypertension, hyperlipidemia, depression, with worsening memory loss. Her MMSE today is 28/30 (29/30 in February 2018, [place] in August 2017, 25/30 in April 2016). Symptoms suggestive of mild cognitive impairment, possible mild dementia. She is tolerating Exelon 1.5mg  BID without side effects. We discussed this is a low dose,  but since they feels she is doing pretty good, we have agreed to stay on current dose We again discussed the importance of control of vascular risk factors, physical exercise, and brain stimulation exercises for brain health. She will follow-up in 6 months and knows to call for any changes.   Thank you for allowing me to participate in her care.  Please do not hesitate to call for any questions or concerns.  The duration of this appointment visit was 25 minutes of face-to-face time with the patient.  Greater than 50% of this time was spent in counseling, explanation of diagnosis, planning of further management, and coordination of care.   Ellouise Newer, M.D.   CC: Dr. Dema Severin

## 2016-11-15 ENCOUNTER — Encounter: Payer: Self-pay | Admitting: Neurology

## 2017-05-18 ENCOUNTER — Other Ambulatory Visit: Payer: Self-pay

## 2017-05-18 ENCOUNTER — Encounter: Payer: Self-pay | Admitting: Neurology

## 2017-05-18 ENCOUNTER — Ambulatory Visit (INDEPENDENT_AMBULATORY_CARE_PROVIDER_SITE_OTHER): Payer: Medicare Other | Admitting: Neurology

## 2017-05-18 VITALS — BP 152/64 | HR 78 | Wt 123.0 lb

## 2017-05-18 DIAGNOSIS — F0390 Unspecified dementia without behavioral disturbance: Secondary | ICD-10-CM | POA: Diagnosis not present

## 2017-05-18 DIAGNOSIS — G629 Polyneuropathy, unspecified: Secondary | ICD-10-CM

## 2017-05-18 DIAGNOSIS — F03A0 Unspecified dementia, mild, without behavioral disturbance, psychotic disturbance, mood disturbance, and anxiety: Secondary | ICD-10-CM

## 2017-05-18 DIAGNOSIS — R2681 Unsteadiness on feet: Secondary | ICD-10-CM

## 2017-05-18 MED ORDER — DONEPEZIL HCL 10 MG PO TABS: 10.0000 mg | ORAL_TABLET | Freq: Every day | ORAL | 6 refills | Status: DC

## 2017-05-18 NOTE — Patient Instructions (Addendum)
1. Refer to PT for balance therapy 2. Would start using cane for balance  3. Stop Exelon, then start Aricept 10mg  every morning 4. Follow-up in 6 months, call for any changes  FALL PRECAUTIONS: Be cautious when walking. Scan the area for obstacles that may increase the risk of trips and falls. When getting up in the mornings, sit up at the edge of the bed for a few minutes before getting out of bed. Consider elevating the bed at the head end to avoid drop of blood pressure when getting up. Walk always in a well-lit room (use night lights in the walls). Avoid area rugs or power cords from appliances in the middle of the walkways. Use a walker or a cane if necessary and consider physical therapy for balance exercise. Get your eyesight checked regularly.  FINANCIAL OVERSIGHT: Supervision, especially oversight when making financial decisions or transactions is also recommended.  HOME SAFETY: Consider the safety of the kitchen when operating appliances like stoves, microwave oven, and blender. Consider having supervision and share cooking responsibilities until no longer able to participate in those. Accidents with firearms and other hazards in the house should be identified and addressed as well.  DRIVING: Regarding driving, in patients with progressive memory problems, driving will be impaired. We advise to have someone else do the driving if trouble finding directions or if minor accidents are reported. Independent driving assessment is available to determine safety of driving.  ABILITY TO BE LEFT ALONE: If patient is unable to contact 911 operator, consider using LifeLine, or when the need is there, arrange for someone to stay with patients. Smoking is a fire hazard, consider supervision or cessation. Risk of wandering should be assessed by caregiver and if detected at any point, supervision and safe proof recommendations should be instituted.  MEDICATION SUPERVISION: Inability to self-administer  medication needs to be constantly addressed. Implement a mechanism to ensure safe administration of the medications.  RECOMMENDATIONS FOR ALL PATIENTS WITH MEMORY PROBLEMS: 1. Continue to exercise (Recommend 30 minutes of walking everyday, or 3 hours every week) 2. Increase social interactions - continue going to [place] and enjoy social gatherings with friends and family 3. Eat healthy, avoid fried foods and eat more fruits and vegetables 4. Maintain adequate blood pressure, blood sugar, and blood cholesterol level. Reducing the risk of stroke and cardiovascular disease also helps promoting better memory. 5. Avoid stressful situations. Live a simple life and avoid aggravations. Organize your time and prepare for the next day in anticipation. 6. Sleep well, avoid any interruptions of sleep and avoid any distractions in the bedroom that may interfere with adequate sleep quality 7. Avoid sugar, avoid sweets as there is a strong link between excessive sugar intake, diabetes, and cognitive impairment We discussed the Mediterranean diet, which has been shown to help patients reduce the risk of progressive memory disorders and reduces cardiovascular risk. This includes eating fish, eat fruits and green leafy vegetables, nuts like almonds and hazelnuts, walnuts, and also use olive oil. Avoid fast foods and fried foods as much as possible. Avoid sweets and sugar as sugar use has been linked to worsening of memory function.  There is always a concern of gradual progression of memory problems. If this is the case, then we may need to adjust level of care according to patient needs. Support, both to the patient and caregiver, should then be put into place.

## 2017-05-18 NOTE — Progress Notes (Signed)
NEUROLOGY FOLLOW UP OFFICE NOTE  Kristen Mclaughlin [id number]  DOB: 1932/05/28  HISTORY OF PRESENT ILLNESS: I had the pleasure of seeing Kristen Mclaughlin in follow-up in the neurology clinic on 05/18/2017.  She is again accompanied by her daughter who helps supplement the history today. The patient was last seen 6 months ago for worsening memory. MMSE in August 2018 was 28/30. Since her last visit, there has been worsening per daughter. Her daughter now administers medications because she was getting too confused. She got lost 4 months ago and has not been driving since then. Her daughter took over bills 4 months ago, she was missing payments and double paying bills. She is taking Exelon 1.5mg  BID, but her daughter would like to go back to Aricept to see if it works better for her, she feels the patient is more unstable walking since dose of Exelon was increased to BID. She has had several falls since her last visit, losing her balance. Her daughter and husband have caught her a few times. She was bruised up pretty badly last weekend. She has back pain, no focal numbness/tingling/weakness. She has constipation. Her daughter denies any personality changes, but she "just talks about how every body is not caring about her," she would forget that family had already called and talked to her. She is independent with dressing and bathing. No paranoia or hallucinations.   HPI April 2016: This is an 84 yo RH woman with a history of hypertension, hyperlipidemia, depression, with worsening memory loss over the past year. She reports "I'm thinking it's leaving me." She forgets names, conversations, misplaces things frequently. She lives with her husband who has noticed the same things. She has gotten lost driving in an unfamiliar place, and has had to call her daughter a couple of times in the past year. She occasionally forgets to take her medications. She has occasional word-finding difficulties. She denies any  missed bill payments, no difficulties with ADLs. She endorses going through a lot, with stress, depression, and anxiety. Her mother and sister have a diagnosis of Alzheimer's disease. She denies any history of head injuries, no alcohol use. She had Bell's palsy twice, she cannot recall which side, but thinks it is on the left side).  Diagnostic Data: MRI brain without contrast done 07/2014 did not show any acute changes, there was moderate atrophy and chronic microvascular disease. TSH and B12 normal.  PAST MEDICAL HISTORY: Past Medical History:  Diagnosis Date  . Asthma   . Hyperlipidemia   . Hypertension     MEDICATIONS: Current Outpatient Medications on File Prior to Visit  Medication Sig Dispense Refill  . albuterol (PROVENTIL HFA;VENTOLIN HFA) 108 (90 BASE) MCG/ACT inhaler Inhale 1 puff into the lungs. As Needed    . aspirin 81 MG tablet Take 81 mg by mouth daily.    . Coenzyme Q10 (CO Q-10) 200 MG CAPS Take 200 mg by mouth daily.    . diclofenac sodium (VOLTAREN) 1 % GEL Apply topically. As Needed    . fluticasone (FLONASE) 50 MCG/ACT nasal spray Place into both nostrils daily.    Marland Kitchen losartan-hydrochlorothiazide (HYZAAR) 100-25 MG per tablet Take 1 tablet by mouth daily.    . rivastigmine (EXELON) 1.5 MG capsule Take 1 capsule twice a day. 180 capsule 3  . sertraline (ZOLOFT) 100 MG tablet Take 100 mg by mouth daily.    . simvastatin (ZOCOR) 40 MG tablet Take 40 mg by mouth daily.    . Vitamin D, Ergocalciferol, (  DRISDOL) 50000 UNITS CAPS capsule Take 50,000 Units by mouth. 1 capsule 1-2 times a week    . vitamin E 400 UNIT capsule Take 400 Units by mouth daily.     No current facility-administered medications on file prior to visit.     ALLERGIES: No Known Allergies  FAMILY HISTORY: Family History  Problem Relation Age of Onset  . Alzheimer's disease Mother   . Asthma Mother   . Alzheimer's disease Sister   . Asthma Sister     SOCIAL HISTORY: Social History    Socioeconomic History  . Marital status: Married    Spouse name: Not on file  . Number of children: 2  . Years of education: Not on file  . Highest education level: Not on file  Social Needs  . Financial resource strain: Not on file  . Food insecurity - worry: Not on file  . Food insecurity - inability: Not on file  . Transportation needs - medical: Not on file  . Transportation needs - non-medical: Not on file  Occupational History  . Occupation: Retired  Tobacco Use  . Smoking status: Never Smoker  . Smokeless tobacco: Never Used  Substance and Sexual Activity  . Alcohol use: No    Alcohol/week: 0.0 oz  . Drug use: No  . Sexual activity: Not on file  Other Topics Concern  . Not on file  Social History Narrative  . Not on file    REVIEW OF SYSTEMS: Constitutional: No fevers, chills, or sweats, no generalized fatigue, change in appetite Eyes: No visual changes, double vision, eye pain Ear, nose and throat: No hearing loss, ear pain, nasal congestion, sore throat Cardiovascular: No chest pain, palpitations Respiratory:  No shortness of breath at rest or with exertion, wheezes GastrointestinaI: No nausea, vomiting, diarrhea, abdominal pain, fecal incontinence Genitourinary:  No dysuria, urinary retention or frequency Musculoskeletal:  No neck pain,+ back pain Integumentary: No rash, pruritus, skin lesions Neurological: as above Psychiatric: No depression, insomnia, anxiety Endocrine: No palpitations, fatigue, diaphoresis, mood swings, change in appetite, change in weight, increased thirst Hematologic/Lymphatic:  No anemia, purpura, petechiae. Allergic/Immunologic: no itchy/runny eyes, nasal congestion, recent allergic reactions, rashes  PHYSICAL EXAM: Vitals:   05/18/17 1527  BP: (!) 152/64  Pulse: 78  SpO2: 97%   General: No acute distress Head:  Normocephalic/atraumatic Neck: supple, no paraspinal tenderness, full range of motion Heart:  Regular rate and  rhythm Lungs:  Clear to auscultation bilaterally Back: No paraspinal tenderness Skin/Extremities: No rash, no edema Neurological Exam: alert and oriented to person, place, and time. No aphasia or dysarthria. Fund of knowledge is appropriate.  Recent and remote memory are intact.  Attention and concentration are normal.    Able to name objects and repeat phrases. CDT 5/5 MMSE - Mini Mental State Exam 05/18/2017 11/15/2016 05/10/2016  Orientation to time 5 4 5   Orientation to Place 5 5 5   Registration 3 3 3   Attention/ Calculation 5 5 5   Recall 2 2 2   Language- name 2 objects 2 2 2   Language- repeat 1 1 1   Language- follow 3 step command 3 3 3   Language- read & follow direction 1 1 1   Write a sentence 1 1 1   Copy design 1 1 1   Total score 29 28 29    Cranial nerves: Pupils asymmetric, right pupil is slightly larger, reactive to light. Extraocular movements intact with no nystagmus. No facial asymmetry. Motor: 5/5 throughout with no pronator drift. Sensation intact to all modalities on  both UE, decreased cold on both feet, intact pin, decreased vibration to ankle on left. No incoordination on finger to nose testing. Reflexes brisk+2 throughout, no ankle clonus, negative Hoffman sign. Gait slow and cautious, Romberg negative, unable to tandem walk.   IMPRESSION: This is a 84 yo RH woman with a history of hypertension, hyperlipidemia, depression, with worsening memory loss. Her MMSE today is 29/30 (28/30 in August 2018, 29/30 in February 2018, [place] in August 2017, 25/30 in April 2016). She is now having more difficulties with complex tasks, she is not driving, daughter has taken over bills and medications. We discussed diagnosis of mild dementia. Her daughter would like to go back to Aricept, she is on a low dose of Exelon and will discontinue medication then start Aricept 10mg  daily. Side effects were again discussed. We discussed balance issues, likely due to back pain and mild neuropathy. She  will be referred for Balance therapy and was advised to use a cane regularly for balance. We again discussed the importance of control of vascular risk factors, physical exercise, and brain stimulation exercises for brain health. She will follow-up in 6 months and knows to call for any changes.   Thank you for allowing me to participate in her care.  Please do not hesitate to call for any questions or concerns.  The duration of this appointment visit was 25 minutes of face-to-face time with the patient.  Greater than 50% of this time was spent in counseling, explanation of diagnosis, planning of further management, and coordination of care.   Ellouise Newer, M.D.   CC: Dr. Dema Severin

## 2017-05-22 ENCOUNTER — Encounter: Payer: Self-pay | Admitting: Neurology

## 2017-11-27 ENCOUNTER — Other Ambulatory Visit: Payer: Self-pay

## 2017-11-27 ENCOUNTER — Ambulatory Visit: Payer: Medicare Other | Admitting: Neurology

## 2017-11-27 ENCOUNTER — Encounter: Payer: Self-pay | Admitting: Neurology

## 2017-11-27 VITALS — BP 150/66 | HR 70 | Ht 61.0 in | Wt 127.0 lb

## 2017-11-27 DIAGNOSIS — F03A0 Unspecified dementia, mild, without behavioral disturbance, psychotic disturbance, mood disturbance, and anxiety: Secondary | ICD-10-CM

## 2017-11-27 DIAGNOSIS — F0390 Unspecified dementia without behavioral disturbance: Secondary | ICD-10-CM

## 2017-11-27 NOTE — Progress Notes (Signed)
NEUROLOGY FOLLOW UP OFFICE NOTE  Kristen Mclaughlin [id number]  DOB: 31-Oct-1932  HISTORY OF PRESENT ILLNESS: I had the pleasure of seeing Kristen Mclaughlin in follow-up in the neurology clinic on 11/27/2017.  She is again accompanied by her daughter who helps supplement the history today. The patient was last seen 6 months ago for worsening memory. MMSE in February 2019 was 29/30 (28/30 in August 2018). Since her last visit, she feels she is doing well. Her daughter reports she is doing "way better." She has been back to driving 5 miles from home without getting lost. Her daughter fixes her pillbox and she remembers her medications but her granddaughter is going to start checking behind her. Her daughter is in charge of finances. She is independent with dressing and bathing. She was having diarrhea felt to be a side effect of interaction between sertraline and donepezil, with switch to Lexapro, the diarrhea has stopped. Her daughter feels her balance is much better with the switch from Exelon to Donepezil 10mg  daily. No further falls. She denies any headaches, dizziness, vision changes, focal numbness/tingling/weakness. No personality changes, paranoia, or hallucinations.   HPI April 2016: This is an 84 yo RH woman with a history of hypertension, hyperlipidemia, depression, with worsening memory loss over the past year. She reports "I'm thinking it's leaving me." She forgets names, conversations, misplaces things frequently. She lives with her husband who has noticed the same things. She has gotten lost driving in an unfamiliar place, and has had to call her daughter a couple of times in the past year. She occasionally forgets to take her medications. She has occasional word-finding difficulties. She denies any missed bill payments, no difficulties with ADLs. She endorses going through a lot, with stress, depression, and anxiety. Her mother and sister have a diagnosis of Alzheimer's disease. She denies any  history of head injuries, no alcohol use. She had Bell's palsy twice, she cannot recall which side, but thinks it is on the left side).  Diagnostic Data: MRI brain without contrast done 07/2014 did not show any acute changes, there was moderate atrophy and chronic microvascular disease. TSH and B12 normal.  PAST MEDICAL HISTORY: Past Medical History:  Diagnosis Date  . Asthma   . Hyperlipidemia   . Hypertension     MEDICATIONS: Current Outpatient Medications on File Prior to Visit  Medication Sig Dispense Refill  . albuterol (PROVENTIL HFA;VENTOLIN HFA) 108 (90 BASE) MCG/ACT inhaler Inhale 1 puff into the lungs. As Needed    . aspirin 81 MG tablet Take 81 mg by mouth daily.    . Coenzyme Q10 (CO Q-10) 200 MG CAPS Take 200 mg by mouth daily.    . diclofenac sodium (VOLTAREN) 1 % GEL Apply topically. As Needed    . donepezil (ARICEPT) 10 MG tablet Take 1 tablet (10 mg total) by mouth at bedtime. 30 tablet 6  . fluticasone (FLONASE) 50 MCG/ACT nasal spray Place into both nostrils daily.    Marland Kitchen losartan-hydrochlorothiazide (HYZAAR) 100-25 MG per tablet Take 1 tablet by mouth daily.    . sertraline (ZOLOFT) 100 MG tablet Take 100 mg by mouth daily.    . simvastatin (ZOCOR) 40 MG tablet Take 40 mg by mouth daily.    . Vitamin D, Ergocalciferol, (DRISDOL) 50000 UNITS CAPS capsule Take 50,000 Units by mouth. 1 capsule 1-2 times a week    . vitamin E 400 UNIT capsule Take 400 Units by mouth daily.     No current facility-administered medications on  file prior to visit.     ALLERGIES: No Known Allergies  FAMILY HISTORY: Family History  Problem Relation Age of Onset  . Alzheimer's disease Mother   . Asthma Mother   . Alzheimer's disease Sister   . Asthma Sister     SOCIAL HISTORY: Social History   Socioeconomic History  . Marital status: Married    Spouse name: Not on file  . Number of children: 2  . Years of education: Not on file  . Highest education level: Not on file    Occupational History  . Occupation: Retired  Scientific laboratory technician  . Financial resource strain: Not on file  . Food insecurity:    Worry: Not on file    Inability: Not on file  . Transportation needs:    Medical: Not on file    Non-medical: Not on file  Tobacco Use  . Smoking status: Never Smoker  . Smokeless tobacco: Never Used  Substance and Sexual Activity  . Alcohol use: No    Alcohol/week: 0.0 standard drinks  . Drug use: No  . Sexual activity: Not on file  Lifestyle  . Physical activity:    Days per week: Not on file    Minutes per session: Not on file  . Stress: Not on file  Relationships  . Social connections:    Talks on phone: Not on file    Gets together: Not on file    Attends religious service: Not on file    Active member of club or organization: Not on file    Attends meetings of clubs or organizations: Not on file    Relationship status: Not on file  . Intimate partner violence:    Fear of current or ex partner: Not on file    Emotionally abused: Not on file    Physically abused: Not on file    Forced sexual activity: Not on file  Other Topics Concern  . Not on file  Social History Narrative  . Not on file    REVIEW OF SYSTEMS: Constitutional: No fevers, chills, or sweats, no generalized fatigue, change in appetite Eyes: No visual changes, double vision, eye pain Ear, nose and throat: No hearing loss, ear pain, nasal congestion, sore throat Cardiovascular: No chest pain, palpitations Respiratory:  No shortness of breath at rest or with exertion, wheezes GastrointestinaI: No nausea, vomiting, diarrhea, abdominal pain, fecal incontinence Genitourinary:  No dysuria, urinary retention or frequency Musculoskeletal:  No neck pain,+ back pain Integumentary: No rash, pruritus, skin lesions Neurological: as above Psychiatric: No depression, insomnia, anxiety Endocrine: No palpitations, fatigue, diaphoresis, mood swings, change in appetite, change in weight,  increased thirst Hematologic/Lymphatic:  No anemia, purpura, petechiae. Allergic/Immunologic: no itchy/runny eyes, nasal congestion, recent allergic reactions, rashes  PHYSICAL EXAM: Vitals:   11/27/17 1547  BP: (!) 150/66  Pulse: 70  SpO2: 94%   General: No acute distress Head:  Normocephalic/atraumatic Neck: supple, no paraspinal tenderness, full range of motion Heart:  Regular rate and rhythm Lungs:  Clear to auscultation bilaterally Back: No paraspinal tenderness Skin/Extremities: No rash, no edema Neurological Exam: alert and oriented to person, place, and time. No aphasia or dysarthria. Fund of knowledge is appropriate.  Recent and remote memory are intact.  Attention and concentration are normal.    Able to name objects and repeat phrases. CDT 4/5 MMSE - Mini Mental State Exam 11/27/2017 05/18/2017 11/15/2016  Orientation to time 5 5 4   Orientation to Place 5 5 5   Registration 3 3 3  Attention/ Calculation 5 5 5   Recall 2 2 2   Language- name 2 objects 2 2 2   Language- repeat 1 1 1   Language- follow 3 step command 3 3 3   Language- read & follow direction 1 1 1   Write a sentence 1 1 1   Copy design 1 1 1   Total score 29 29 28    Cranial nerves: Pupils asymmetric, right pupil is slightly larger, reactive to light. Extraocular movements intact with no nystagmus. No facial asymmetry. Motor: 5/5 throughout with no pronator drift. Sensation intact to all modalities on both UE, decreased cold on both feet, intact pin, decreased vibration to ankle on left. No incoordination on finger to nose testing. Reflexes brisk+2 throughout. Gait narrow-based and steady, difficulty with tandem walk. Romberg negative  IMPRESSION: This is a 84 yo RH woman with a history of hypertension, hyperlipidemia, depression, with mild dementia. MMSE today 29/30. On prior visits, family was reporting difficulties with complex tasks, she appears to be managing better with Donepezil 10mg  daily. She is back to  driving, continue to monitor, discussed restricting to 5 mile radius, daytime driving. Refills for Donepezil sent. We again discussed the importance of control of vascular risk factors, physical exercise, and brain stimulation exercises for brain health. She will follow-up in 6 months and knows to call for any changes.   Thank you for allowing me to participate in her care.  Please do not hesitate to call for any questions or concerns.  The duration of this appointment visit was 30 minutes of face-to-face time with the patient.  Greater than 50% of this time was spent in counseling, explanation of diagnosis, planning of further management, and coordination of care.   Ellouise Newer, M.D.   CC: Dr. Dema Severin

## 2017-11-27 NOTE — Patient Instructions (Signed)
Great seeing you! Continue Donepezil 10mg  daily. Follow-up in 6 months or so, call for any changes.  FALL PRECAUTIONS: Be cautious when walking. Scan the area for obstacles that may increase the risk of trips and falls. When getting up in the mornings, sit up at the edge of the bed for a few minutes before getting out of bed. Consider elevating the bed at the head end to avoid drop of blood pressure when getting up. Walk always in a well-lit room (use night lights in the walls). Avoid area rugs or power cords from appliances in the middle of the walkways. Use a walker or a cane if necessary and consider physical therapy for balance exercise. Get your eyesight checked regularly.  HOME SAFETY: Consider the safety of the kitchen when operating appliances like stoves, microwave oven, and blender. Consider having supervision and share cooking responsibilities until no longer able to participate in those. Accidents with firearms and other hazards in the house should be identified and addressed as well.  DRIVING: Regarding driving, in patients with progressive memory problems, driving will be impaired. We advise to have someone else do the driving if trouble finding directions or if minor accidents are reported. Independent driving assessment is available to determine safety of driving.  ABILITY TO BE LEFT ALONE: If patient is unable to contact 911 operator, consider using LifeLine, or when the need is there, arrange for someone to stay with patients. Smoking is a fire hazard, consider supervision or cessation. Risk of wandering should be assessed by caregiver and if detected at any point, supervision and safe proof recommendations should be instituted.  MEDICATION SUPERVISION: Inability to self-administer medication needs to be constantly addressed. Implement a mechanism to ensure safe administration of the medications.  RECOMMENDATIONS FOR ALL PATIENTS WITH MEMORY PROBLEMS: 1. Continue to exercise (Recommend  30 minutes of walking everyday, or 3 hours every week) 2. Increase social interactions - continue going to [place] and enjoy social gatherings with friends and family 3. Eat healthy, avoid fried foods and eat more fruits and vegetables 4. Maintain adequate blood pressure, blood sugar, and blood cholesterol level. Reducing the risk of stroke and cardiovascular disease also helps promoting better memory. 5. Avoid stressful situations. Live a simple life and avoid aggravations. Organize your time and prepare for the next day in anticipation. 6. Sleep well, avoid any interruptions of sleep and avoid any distractions in the bedroom that may interfere with adequate sleep quality 7. Avoid sugar, avoid sweets as there is a strong link between excessive sugar intake, diabetes, and cognitive impairment The Mediterranean diet has been shown to help patients reduce the risk of progressive memory disorders and reduces cardiovascular risk. This includes eating fish, eat fruits and green leafy vegetables, nuts like almonds and hazelnuts, walnuts, and also use olive oil. Avoid fast foods and fried foods as much as possible. Avoid sweets and sugar as sugar use has been linked to worsening of memory function.  There is always a concern of gradual progression of memory problems. If this is the case, then we may need to adjust level of care according to patient needs. Support, both to the patient and caregiver, should then be put into place.

## 2017-11-28 ENCOUNTER — Encounter: Payer: Self-pay | Admitting: Neurology

## 2017-12-18 ENCOUNTER — Encounter: Payer: Medicare Other | Admitting: Neurology

## 2018-07-23 ENCOUNTER — Ambulatory Visit: Payer: Medicare Other | Admitting: Neurology

## 2018-09-26 ENCOUNTER — Other Ambulatory Visit: Payer: Self-pay | Admitting: Family Medicine

## 2018-09-26 ENCOUNTER — Ambulatory Visit
Admission: RE | Admit: 2018-09-26 | Discharge: 2018-09-26 | Disposition: A | Discharge status: Home / Self Care | Payer: Medicare Other | Source: Ambulatory Visit | Attending: Family Medicine | Admitting: Family Medicine

## 2018-09-26 DIAGNOSIS — M545 Low back pain, unspecified: Secondary | ICD-10-CM

## 2018-10-01 ENCOUNTER — Other Ambulatory Visit: Payer: Self-pay | Admitting: Family Medicine

## 2018-10-01 DIAGNOSIS — Z1231 Encounter for screening mammogram for malignant neoplasm of breast: Secondary | ICD-10-CM

## 2018-10-01 DIAGNOSIS — M810 Age-related osteoporosis without current pathological fracture: Secondary | ICD-10-CM

## 2019-01-30 ENCOUNTER — Other Ambulatory Visit: Payer: Self-pay | Admitting: Gastroenterology

## 2019-01-30 DIAGNOSIS — R1310 Dysphagia, unspecified: Secondary | ICD-10-CM

## 2019-02-05 ENCOUNTER — Other Ambulatory Visit: Payer: Self-pay | Admitting: Gastroenterology

## 2019-02-05 ENCOUNTER — Ambulatory Visit
Admission: RE | Admit: 2019-02-05 | Discharge: 2019-02-05 | Disposition: A | Discharge status: Home / Self Care | Payer: Medicare Other | Source: Ambulatory Visit | Attending: Gastroenterology | Admitting: Gastroenterology

## 2019-02-05 DIAGNOSIS — R1310 Dysphagia, unspecified: Secondary | ICD-10-CM

## 2019-04-16 ENCOUNTER — Other Ambulatory Visit: Payer: Self-pay | Admitting: Family Medicine

## 2019-04-16 DIAGNOSIS — G3184 Mild cognitive impairment, so stated: Secondary | ICD-10-CM

## 2019-04-22 ENCOUNTER — Other Ambulatory Visit: Payer: Self-pay

## 2019-04-22 ENCOUNTER — Ambulatory Visit
Admission: RE | Admit: 2019-04-22 | Discharge: 2019-04-22 | Disposition: A | Discharge status: Home / Self Care | Payer: Medicare Other | Source: Ambulatory Visit | Attending: Family Medicine | Admitting: Family Medicine

## 2019-04-22 DIAGNOSIS — G3184 Mild cognitive impairment, so stated: Secondary | ICD-10-CM

## 2019-06-19 ENCOUNTER — Encounter: Payer: Self-pay | Admitting: *Deleted

## 2019-06-19 ENCOUNTER — Other Ambulatory Visit: Payer: Self-pay | Admitting: *Deleted

## 2019-06-23 ENCOUNTER — Ambulatory Visit: Payer: Medicare Other | Admitting: Diagnostic Neuroimaging

## 2019-07-23 ENCOUNTER — Other Ambulatory Visit: Payer: Self-pay

## 2019-07-23 ENCOUNTER — Encounter: Payer: Self-pay | Admitting: Diagnostic Neuroimaging

## 2019-07-23 ENCOUNTER — Ambulatory Visit: Payer: Medicare Other | Admitting: Diagnostic Neuroimaging

## 2019-07-23 VITALS — BP 177/73 | HR 75 | Temp 97.4°F | Ht 61.5 in | Wt 130.0 lb

## 2019-07-23 DIAGNOSIS — F03B18 Unspecified dementia, moderate, with other behavioral disturbance: Secondary | ICD-10-CM

## 2019-07-23 DIAGNOSIS — F0391 Unspecified dementia with behavioral disturbance: Secondary | ICD-10-CM

## 2019-07-23 MED ORDER — MEMANTINE HCL 10 MG PO TABS: 10.0000 mg | ORAL_TABLET | Freq: Two times a day (BID) | ORAL | 12 refills | Status: AC

## 2019-07-23 NOTE — Patient Instructions (Signed)
MODERATE DEMENTIA - increase memantine to 10mg  twice a day - continue lexapro for mood stabilization - safety / supervision issues reviewed - caregiver resources provided - no driving, caution with medications and finances

## 2019-07-23 NOTE — Progress Notes (Signed)
[facility]  PATIENT: Kristen Mclaughlin DOB: 12/09/1932  REFERRING CLINICIAN: Harlan Stains, MD HISTORY FROM: patient  REASON FOR VISIT: new consult    HISTORICAL  CHIEF COMPLAINT:  Chief Complaint  Patient presents with  . Dementia    rm 7 New Pt, dgtr/HC POA - [name] MMSE 18    HISTORY OF PRESENT ILLNESS:   86 year old female here for evaluation of dementia.  Symptoms started in 2019, gradual onset short-term memory loss, getting lost, difficulty with medications, difficulty with functioning independently outside the home.  She was diagnosed with dementia and started on donepezil.  Patient had some side effects and now is on memantine.  Symptoms are progressive over time.  Patient has limited insight into these problems.  She has not been driving.  Family helps with most of household chores.  She has been more depressed, staying in bed up to 20 hours/day.   REVIEW OF SYSTEMS: Full 14 system review of systems performed and negative with exception of: As per HPI.  ALLERGIES: Allergies  Allergen Reactions  . Alendronate     Blurry vision  . Boniva [Ibandronic Acid]     Body aches  . Colestipol     Visual changes  . Donepezil Diarrhea  . Lipitor [Atorvastatin]     Body aches  . Niacin And Related     aches  . Pravastatin     Blurry vision  . Rivastigmine     Balance difficulty  . Rosuvastatin     Body aches  . Welchol [Colesevelam]     Blurry vision    HOME MEDICATIONS: Outpatient Medications Prior to Visit  Medication Sig Dispense Refill  . albuterol (PROVENTIL HFA;VENTOLIN HFA) 108 (90 BASE) MCG/ACT inhaler Inhale 1 puff into the lungs. As Needed    . aspirin 81 MG tablet Take 81 mg by mouth daily.    . Coenzyme Q10 (CO Q-10) 200 MG CAPS Take 200 mg by mouth daily.    . diclofenac sodium (VOLTAREN) 1 % GEL Apply topically. As Needed    . escitalopram (LEXAPRO) 10 MG tablet TK 1 T PO QD  1  . fluticasone (FLONASE) 50 MCG/ACT nasal spray  Place into both nostrils daily.    Marland Kitchen losartan-hydrochlorothiazide (HYZAAR) 50-12.5 MG tablet Take 1 tablet by mouth daily.    . memantine (NAMENDA) 5 MG tablet Take 5 mg by mouth 2 (two) times daily.    . pantoprazole (PROTONIX) 40 MG tablet Take 40 mg by mouth daily.    . simvastatin (ZOCOR) 40 MG tablet Take 40 mg by mouth daily.    Marland Kitchen triamcinolone cream (KENALOG) 0.1 % APPLY EXTERNALLY TO THE AFFECTED AREA TWICE DAILY AS NEEDED    . Vitamin D, Ergocalciferol, (DRISDOL) 50000 UNITS CAPS capsule Take 50,000 Units by mouth. 1 capsule 1-2 times a week    . vitamin E 400 UNIT capsule Take 400 Units by mouth daily.    Marland Kitchen donepezil (ARICEPT) 10 MG tablet Take 1 tablet (10 mg total) by mouth at bedtime. (Patient not taking: Reported on 07/23/2019) 30 tablet 6  . losartan-hydrochlorothiazide (HYZAAR) 100-25 MG per tablet Take 1 tablet by mouth daily.     No facility-administered medications prior to visit.    PAST MEDICAL HISTORY: Past Medical History:  Diagnosis Date  . Anxiety   . Asthma   . CKD (chronic kidney disease), stage III   . Dementia ([place])   . Dysphagia   . Hearing loss    left ear  deaf, heairng aid on right  . Hyperlipidemia   . Hypertension   . MCI (mild cognitive impairment)   . Melanoma ([place])    hx of facial  . Memory loss   . Osteoporosis   . Vitamin D deficiency     PAST SURGICAL HISTORY: Past Surgical History:  Procedure Laterality Date  . APPENDECTOMY    . CATARACT EXTRACTION    . KNEE SURGERY     right    FAMILY HISTORY: Family History  Problem Relation Age of Onset  . Alzheimer's disease Mother   . Asthma Mother   . Alzheimer's disease Sister   . Asthma Sister     SOCIAL HISTORY: Social History   Socioeconomic History  . Marital status: Married    Spouse name: Marcello Moores  . Number of children: 2  . Years of education: Not on file  . Highest education level: Some college, no degree  Occupational History  . Occupation: Retired    Comment: office  work  Tobacco Use  . Smoking status: Never Smoker  . Smokeless tobacco: Never Used  Substance and Sexual Activity  . Alcohol use: No    Alcohol/week: 0.0 standard drinks  . Drug use: No  . Sexual activity: Not on file  Other Topics Concern  . Not on file  Social History Narrative   07/23/19 lives at home with spouse   Social Determinants of Health   Financial Resource Strain:   . Difficulty of Paying Living Expenses:   Food Insecurity:   . Worried About Charity fundraiser in the Last Year:   . Arboriculturist in the Last Year:   Transportation Needs:   . Film/video editor (Medical):   Marland Kitchen Lack of Transportation (Non-Medical):   Physical Activity:   . Days of Exercise per Week:   . Minutes of Exercise per Session:   Stress:   . Feeling of Stress :   Social Connections:   . Frequency of Communication with Friends and Family:   . Frequency of Social Gatherings with Friends and Family:   . Attends Religious Services:   . Active Member of Clubs or Organizations:   . Attends Archivist Meetings:   Marland Kitchen Marital Status:   Intimate Partner Violence:   . Fear of Current or Ex-Partner:   . Emotionally Abused:   Marland Kitchen Physically Abused:   . Sexually Abused:      PHYSICAL EXAM  GENERAL EXAM/CONSTITUTIONAL: Vitals:  Vitals:   07/23/19 0940  BP: (!) 177/73  Pulse: 75  Temp: (!) 97.4 F (36.3 C)  Weight: 130 lb (59 kg)  Height: 5' 1.5" (1.562 m)     Body mass index is 24.17 kg/m. Wt Readings from Last 3 Encounters:  07/23/19 130 lb (59 kg)  11/27/17 127 lb (57.6 kg)  05/18/17 123 lb (55.8 kg)     Patient is in no distress; well developed, nourished and groomed; neck is supple  CARDIOVASCULAR:  Examination of carotid arteries is normal; no carotid bruits  Regular rate and rhythm, no murmurs  Examination of peripheral vascular system by observation and palpation is normal  EYES:  Ophthalmoscopic exam of optic discs and posterior segments is normal;  no papilledema or hemorrhages  No exam data present  MUSCULOSKELETAL:  Gait, strength, tone, movements noted in Neurologic exam below  NEUROLOGIC: MENTAL STATUS:  MMSE - Mini Mental State Exam 07/23/2019 11/27/2017 05/18/2017  Orientation to time 2 5 5   Orientation to Place 4 5  5  Registration 3 3 3   Attention/ Calculation 1 5 5   Recall 1 2 2   Language- name 2 objects 2 2 2   Language- repeat 0 1 1  Language- follow 3 step command 3 3 3   Language- read & follow direction 1 1 1   Write a sentence 1 1 1   Copy design 0 1 1  Total score 18 29 29     awake, alert, oriented to person, place and time  recent and remote memory intact  normal attention and concentration  language fluent, comprehension intact, naming intact  fund of knowledge appropriate  CRANIAL NERVE:   2nd - no papilledema on fundoscopic exam  2nd, 3rd, 4th, 6th - pupils equal and reactive to light, visual fields full to confrontation, extraocular muscles intact, no nystagmus  5th - facial sensation symmetric  7th - facial strength symmetric  8th - hearing intact  9th - palate elevates symmetrically, uvula midline  11th - shoulder shrug symmetric  12th - tongue protrusion midline  MOTOR:   normal bulk and tone, full strength in the BUE, BLE  SENSORY:   normal and symmetric to light touch  COORDINATION:   finger-nose-finger, fine finger movements normal  REFLEXES:   deep tendon reflexes TRACE and symmetric  GAIT/STATION:   narrow based gait; MILD UNSTEADY     DIAGNOSTIC DATA (LABS, IMAGING, TESTING) - I reviewed patient records, labs, notes, testing and imaging myself where available.  No results found for: WBC, HGB, HCT, MCV, PLT No results found for: NA, K, CL, CO2, GLUCOSE, BUN, CREATININE, CALCIUM, PROT, ALBUMIN, AST, ALT, ALKPHOS, BILITOT, GFRNONAA, GFRAA No results found for: CHOL, HDL, LDLCALC, LDLDIRECT, TRIG, CHOLHDL No results found for: HGBA1C Lab Results  Component  Value Date   VITAMINB12 346 07/27/2014   Lab Results  Component Value Date   TSH 2.088 07/27/2014    04/22/19 MRI brain [I reviewed images myself and agree with interpretation. -VRP] - Motion degraded examination. - No evidence of acute intracranial abnormality. - Moderate chronic small vessel ischemic disease, slightly progressed from prior MRI 07/29/2014. - Stable, moderate cerebral atrophy without definite lobar predominance. - Right mastoid effusion.   ASSESSMENT AND PLAN  85 y.o. year old female here with moderate dementia.   Dx:  1. Moderate dementia with behavioral disturbance (HCC)      PLAN:  MODERATE DEMENTIA - increase memantine to 10mg  twice a day - continue lexapro for mood stabilization - safety / supervision issues reviewed - caregiver resources provided - no driving, caution with medications and finances  Meds ordered this encounter  Medications  . memantine (NAMENDA) 10 MG tablet    Sig: Take 1 tablet (10 mg total) by mouth 2 (two) times daily.    Dispense:  60 tablet    Refill:  12   Return for pending if symptoms worsen or fail to improve.    Penni Bombard, MD [id number], [id number] AM Certified in Neurology, Neurophysiology and Neuroimaging  [facility] [address] [place], [place] [address] [phone]

## 2019-11-20 ENCOUNTER — Telehealth: Payer: Self-pay | Admitting: Diagnostic Neuroimaging

## 2019-11-20 NOTE — Telephone Encounter (Signed)
[facility] Mickel Baas) called Pt starting packaging on Monday for memantine (NAMENDA) 10 MG tablet will need prescription for increase dosage to 10 mg. Pt refill for memantine (NAMENDA) 10 MG tablet can be sent [facility].

## 2019-11-20 NOTE — Telephone Encounter (Signed)
Called Mickel Baas to clarify and LVM for return call.

## 2019-11-20 NOTE — Telephone Encounter (Signed)
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 I advised her namenda was refilled April 2021 x 1 year to [place] [place] She stated she'll call [facility] and have them call [facility] to transfer Rx. She  verbalized understanding, appreciation.

## 2020-04-05 DIAGNOSIS — R61 Generalized hyperhidrosis: Secondary | ICD-10-CM | POA: Diagnosis not present

## 2020-04-05 DIAGNOSIS — R1111 Vomiting without nausea: Secondary | ICD-10-CM | POA: Diagnosis not present

## 2020-04-05 DIAGNOSIS — R0902 Hypoxemia: Secondary | ICD-10-CM | POA: Diagnosis not present

## 2020-04-05 DIAGNOSIS — I10 Essential (primary) hypertension: Secondary | ICD-10-CM | POA: Diagnosis not present

## 2020-04-19 DIAGNOSIS — K219 Gastro-esophageal reflux disease without esophagitis: Secondary | ICD-10-CM | POA: Diagnosis not present

## 2020-04-19 DIAGNOSIS — M810 Age-related osteoporosis without current pathological fracture: Secondary | ICD-10-CM | POA: Diagnosis not present

## 2020-04-19 DIAGNOSIS — E785 Hyperlipidemia, unspecified: Secondary | ICD-10-CM | POA: Diagnosis not present

## 2020-04-19 DIAGNOSIS — I129 Hypertensive chronic kidney disease with stage 1 through stage 4 chronic kidney disease, or unspecified chronic kidney disease: Secondary | ICD-10-CM | POA: Diagnosis not present

## 2020-05-18 DIAGNOSIS — K219 Gastro-esophageal reflux disease without esophagitis: Secondary | ICD-10-CM | POA: Diagnosis not present

## 2020-05-18 DIAGNOSIS — J4520 Mild intermittent asthma, uncomplicated: Secondary | ICD-10-CM | POA: Diagnosis not present

## 2020-05-18 DIAGNOSIS — M810 Age-related osteoporosis without current pathological fracture: Secondary | ICD-10-CM | POA: Diagnosis not present

## 2020-05-18 DIAGNOSIS — I129 Hypertensive chronic kidney disease with stage 1 through stage 4 chronic kidney disease, or unspecified chronic kidney disease: Secondary | ICD-10-CM | POA: Diagnosis not present

## 2020-05-18 DIAGNOSIS — E785 Hyperlipidemia, unspecified: Secondary | ICD-10-CM | POA: Diagnosis not present

## 2020-05-30 ENCOUNTER — Emergency Department (HOSPITAL_BASED_OUTPATIENT_CLINIC_OR_DEPARTMENT_OTHER): Payer: Medicare Other

## 2020-05-30 ENCOUNTER — Other Ambulatory Visit: Payer: Self-pay

## 2020-05-30 ENCOUNTER — Encounter (HOSPITAL_BASED_OUTPATIENT_CLINIC_OR_DEPARTMENT_OTHER): Payer: Self-pay

## 2020-05-30 ENCOUNTER — Emergency Department (HOSPITAL_BASED_OUTPATIENT_CLINIC_OR_DEPARTMENT_OTHER)
Admission: EM | Admit: 2020-05-30 | Discharge: 2020-05-30 | Disposition: A | Discharge status: Home / Self Care | Payer: Medicare Other | Attending: Emergency Medicine | Admitting: Emergency Medicine

## 2020-05-30 DIAGNOSIS — R0602 Shortness of breath: Secondary | ICD-10-CM | POA: Diagnosis not present

## 2020-05-30 DIAGNOSIS — Z79899 Other long term (current) drug therapy: Secondary | ICD-10-CM | POA: Diagnosis not present

## 2020-05-30 DIAGNOSIS — I129 Hypertensive chronic kidney disease with stage 1 through stage 4 chronic kidney disease, or unspecified chronic kidney disease: Secondary | ICD-10-CM | POA: Insufficient documentation

## 2020-05-30 DIAGNOSIS — R059 Cough, unspecified: Secondary | ICD-10-CM | POA: Insufficient documentation

## 2020-05-30 DIAGNOSIS — N183 Chronic kidney disease, stage 3 unspecified: Secondary | ICD-10-CM | POA: Insufficient documentation

## 2020-05-30 DIAGNOSIS — F0390 Unspecified dementia without behavioral disturbance: Secondary | ICD-10-CM | POA: Diagnosis not present

## 2020-05-30 DIAGNOSIS — R42 Dizziness and giddiness: Secondary | ICD-10-CM | POA: Insufficient documentation

## 2020-05-30 DIAGNOSIS — Z7982 Long term (current) use of aspirin: Secondary | ICD-10-CM | POA: Diagnosis not present

## 2020-05-30 DIAGNOSIS — J4521 Mild intermittent asthma with (acute) exacerbation: Secondary | ICD-10-CM | POA: Diagnosis not present

## 2020-05-30 LAB — BASIC METABOLIC PANEL
Anion gap: 12 (ref 5–15)
BUN: 15 mg/dL (ref 8–23)
CO2: 25 mmol/L (ref 22–32)
Calcium: 9.8 mg/dL (ref 8.9–10.3)
Chloride: 103 mmol/L (ref 98–111)
Creatinine, Ser: 1.29 mg/dL — ABNORMAL HIGH (ref 0.44–1.00)
GFR, Estimated: 41 mL/min — ABNORMAL LOW (ref >60)
Glucose, Bld: 132 mg/dL — ABNORMAL HIGH (ref 70–99)
Potassium: 3.2 mmol/L — ABNORMAL LOW (ref 3.5–5.1)
Sodium: 140 mmol/L (ref 135–145)

## 2020-05-30 LAB — CBC
HCT: 35.6 % — ABNORMAL LOW (ref 36.0–46.0)
Hemoglobin: 11.9 g/dL — ABNORMAL LOW (ref 12.0–15.0)
MCH: 33 pg (ref 26.0–34.0)
MCHC: 33.4 g/dL (ref 30.0–36.0)
MCV: 98.6 fL (ref 80.0–100.0)
Platelets: 215 10*3/uL (ref 150–400)
RBC: 3.61 MIL/uL — ABNORMAL LOW (ref 3.87–5.11)
RDW: 13.5 % (ref 11.5–15.5)
WBC: 7.7 10*3/uL (ref 4.0–10.5)
nRBC: 0 % (ref 0.0–0.2)

## 2020-05-30 LAB — TROPONIN I (HIGH SENSITIVITY)
Troponin I (High Sensitivity): 15 ng/L (ref <18)
Troponin I (High Sensitivity): 13 ng/L (ref <18)

## 2020-05-30 LAB — BRAIN NATRIURETIC PEPTIDE: B Natriuretic Peptide: 48.6 pg/mL (ref 0.0–100.0)

## 2020-05-30 MED ORDER — ALBUTEROL SULFATE (5 MG/ML) 0.5% IN NEBU: 2.5000 mg | INHALATION_SOLUTION | Freq: Four times a day (QID) | RESPIRATORY_TRACT | 0 refills | Status: AC | PRN

## 2020-05-30 MED ORDER — PREDNISONE 20 MG PO TABS: ORAL_TABLET | ORAL | 0 refills | Status: DC

## 2020-05-30 MED ORDER — MAGNESIUM SULFATE 2 GM/50ML IV SOLN
2.0000 g | Freq: Once | INTRAVENOUS | Status: AC
  Administered 2020-05-30: 2 g via INTRAVENOUS
  Filled 2020-05-30: qty 50

## 2020-05-30 MED ORDER — ALBUTEROL SULFATE HFA 108 (90 BASE) MCG/ACT IN AERS
2.0000 | INHALATION_SPRAY | Freq: Once | RESPIRATORY_TRACT | Status: AC
  Administered 2020-05-30: 2 via RESPIRATORY_TRACT
  Filled 2020-05-30: qty 6.7

## 2020-05-30 MED ORDER — METHYLPREDNISOLONE SODIUM SUCC 125 MG IJ SOLR
125.0000 mg | Freq: Once | INTRAMUSCULAR | Status: AC
  Administered 2020-05-30: 125 mg via INTRAVENOUS
  Filled 2020-05-30: qty 2

## 2020-05-30 NOTE — ED Triage Notes (Addendum)
Pt been complaining of SOB, dizziness and elevated heart rate x3days. Hx of dementia, with daughter who is speaking for pt. Had breathing treatment pta. NAD noted during triage. Daughter states also has had dry cough. Not vaccinated for COVID

## 2020-05-30 NOTE — ED Provider Notes (Signed)
[place] EMERGENCY DEPARTMENT Provider Note   CSN: [id number] Arrival date & time: 05/30/20  1322     History Chief Complaint  Patient presents with  . Shortness of Breath    Kristen Mclaughlin is a 87 y.o. female hx of CKD, dementia, HTN, HL here with cough, SOB, dizziness. Patient is demented and lives at home with daughter. Per daughter, she has been having dry cough. She also has been complaining of shortness of breath. Patient has no fever or vomiting. Patient is not vaccinated for COVID but has no COVID exposures.   The history is provided by the patient and a relative.  Level v caveat- dementia      Past Medical History:  Diagnosis Date  . Anxiety   . Asthma   . CKD (chronic kidney disease), stage III ([place])   . Dementia ([place])   . Dysphagia   . Hearing loss    left ear deaf, heairng aid on right  . Hyperlipidemia   . Hypertension   . MCI (mild cognitive impairment)   . Melanoma ([place])    hx of facial  . Memory loss   . Osteoporosis   . Vitamin D deficiency     Patient Active Problem List   Diagnosis Date Noted  . Mild cognitive impairment 11/01/2015  . Essential hypertension 07/24/2014  . Hyperlipidemia 07/24/2014  . Depression 07/24/2014  . Memory loss 07/21/2014    Past Surgical History:  Procedure Laterality Date  . APPENDECTOMY    . CATARACT EXTRACTION    . KNEE SURGERY     right     OB History   No obstetric history on file.     Family History  Problem Relation Age of Onset  . Alzheimer's disease Mother   . Asthma Mother   . Alzheimer's disease Sister   . Asthma Sister     Social History   Tobacco Use  . Smoking status: Never Smoker  . Smokeless tobacco: Never Used  Substance Use Topics  . Alcohol use: No    Alcohol/week: 0.0 standard drinks  . Drug use: No    Home Medications Prior to Admission medications   Medication Sig Start Date End Date Taking? Authorizing Provider  albuterol (PROVENTIL HFA;VENTOLIN HFA)  108 (90 BASE) MCG/ACT inhaler Inhale 1 puff into the lungs. As Needed    [provider]  aspirin 81 MG tablet Take 81 mg by mouth daily.    [provider]  Coenzyme Q10 (CO Q-10) 200 MG CAPS Take 200 mg by mouth daily.    [provider]  diclofenac sodium (VOLTAREN) 1 % GEL Apply topically. As Needed    [provider]  escitalopram (LEXAPRO) 10 MG tablet TK 1 T PO QD 10/20/17   [provider]  fluticasone (FLONASE) 50 MCG/ACT nasal spray Place into both nostrils daily.    [provider]  losartan-hydrochlorothiazide (HYZAAR) 50-12.5 MG tablet Take 1 tablet by mouth daily. 05/13/19   [provider]  memantine (NAMENDA) 10 MG tablet Take 1 tablet (10 mg total) by mouth 2 (two) times daily. 07/23/19   Penumalli, Earlean Polka, MD  pantoprazole (PROTONIX) 40 MG tablet Take 40 mg by mouth daily. 01/20/19   [provider]  simvastatin (ZOCOR) 40 MG tablet Take 40 mg by mouth daily.    [provider]  triamcinolone cream (KENALOG) 0.1 % APPLY EXTERNALLY TO THE AFFECTED AREA TWICE DAILY AS NEEDED 03/13/19   [provider]  Vitamin  D, Ergocalciferol, (DRISDOL) 50000 UNITS CAPS capsule Take 50,000 Units by mouth. 1 capsule 1-2 times a week    [provider]  vitamin E 400 UNIT capsule Take 400 Units by mouth daily.    [provider]    Allergies    Alendronate, Boniva [ibandronic acid], Colestipol, Donepezil, Lipitor [atorvastatin], Niacin and related, Pravastatin, Rivastigmine, Rosuvastatin, and Welchol [colesevelam]  Review of Systems   Review of Systems  Respiratory: Positive for shortness of breath.   All other systems reviewed and are negative.   Physical Exam Updated Vital Signs BP (!) 114/34 (BP Location: Right Arm)   Pulse 85   Temp 98.3 F (36.8 C) (Oral)   Resp (!) 21   Ht 5\' 1"  (1.549 m)   Wt 63.5 kg   SpO2 96%   BMI 26.45 kg/m   Physical Exam Vitals and nursing note  reviewed.  Constitutional:      Comments: Demented   HENT:     Head: Normocephalic.  Eyes:     Extraocular Movements: Extraocular movements intact.     Pupils: Pupils are equal, round, and reactive to light.  Cardiovascular:     Rate and Rhythm: Normal rate and regular rhythm.  Pulmonary:     Comments: Slightly tachypneic, dry crackles bilateral bases, no obvious wheezing  Abdominal:     General: Bowel sounds are normal.     Palpations: Abdomen is soft.  Musculoskeletal:        General: Normal range of motion.     Cervical back: Normal range of motion and neck supple.  Skin:    General: Skin is warm.     Capillary Refill: Capillary refill takes less than 2 seconds.  Neurological:     General: No focal deficit present.     Comments: Demented, moving all extremities   Psychiatric:        Mood and Affect: Mood normal.        Behavior: Behavior normal.     ED Results / Procedures / Treatments   Labs (all labs ordered are listed, but only abnormal results are displayed) Labs Reviewed  BASIC METABOLIC PANEL - Abnormal; Notable for the following components:      Result Value   Potassium 3.2 (*)    Glucose, Bld 132 (*)    Creatinine, Ser 1.29 (*)    GFR, Estimated 41 (*)    All other components within normal limits  CBC - Abnormal; Notable for the following components:   RBC 3.61 (*)    Hemoglobin 11.9 (*)    HCT 35.6 (*)    All other components within normal limits  BRAIN NATRIURETIC PEPTIDE  TROPONIN I (HIGH SENSITIVITY)  TROPONIN I (HIGH SENSITIVITY)    EKG EKG Interpretation  Date/Time:  Sunday May 30 2020 13:34:39 EST Ventricular Rate:  91 PR Interval:  150 QRS Duration: 74 QT Interval:  344 QTC Calculation: 423 R Axis:   36 Text Interpretation: Normal sinus rhythm Nonspecific ST and T wave abnormality Abnormal ECG No previous ECGs available Confirmed by Wandra Arthurs [id number]) on 05/30/2020 3:17:42 PM   Radiology DG Chest Portable 1 View  Result Date:  05/30/2020 CLINICAL DATA:  Shortness of breath. EXAM: PORTABLE CHEST 1 VIEW COMPARISON:  October 16, 2016 FINDINGS: The heart size and mediastinal contours are within normal limits. Both lungs are clear. Degenerative changes seen throughout the thoracic spine. IMPRESSION: No active disease. Electronically Signed   By: Virgina Norfolk M.D.   On: 05/30/2020 15:54  Procedures Procedures   Medications Ordered in ED Medications  methylPREDNISolone sodium succinate (SOLU-MEDROL) 125 mg/2 mL injection 125 mg (125 mg Intravenous Given 05/30/20 1550)  albuterol (VENTOLIN HFA) 108 (90 Base) MCG/ACT inhaler 2 puff (2 puffs Inhalation Given 05/30/20 1539)  magnesium sulfate IVPB 2 g 50 mL (0 g Intravenous Stopped 05/30/20 1637)    ED Course  I have reviewed the triage vital signs and the nursing notes.  Pertinent labs & imaging results that were available during my care of the patient were reviewed by me and considered in my medical decision making (see chart for details).    MDM Rules/Calculators/A&P                         CHARISSE WENDELL is a 87 y.o. female here with cough. Likely asthma exacerbation vs pneumonia. Afebrile, no oxygen requirement. Patient not vaccinated against covid but has no known COVID exposure. Daughter wants to hold off on COVID testing right now. Will get cbc, cmp, trop x 1, BNP, CXR. Will give albuterol and steroids.   6:06 PM Labs unremarkable. CXR clear. Trop normal. Given solumedrol and albuterol and felt better. Never hypoxic. Likely mild asthma exacerbation. Will dc home with prednisone, albuterol.    Final Clinical Impression(s) / ED Diagnoses Final diagnoses:  None    Rx / DC Orders ED Discharge Orders    None       Drenda Freeze, MD 05/30/20 1807

## 2020-05-30 NOTE — Discharge Instructions (Signed)
You likely have mild asthma exacerbation   Take prednisone as prescribed   Use albuterol every 4 hrs for wheezing or cough   See your doctor for follow up   Return to ER if you have worse shortness of breath, cough, fever, trouble breathing

## 2020-06-07 DIAGNOSIS — R296 Repeated falls: Secondary | ICD-10-CM | POA: Diagnosis not present

## 2020-06-07 DIAGNOSIS — J4520 Mild intermittent asthma, uncomplicated: Secondary | ICD-10-CM | POA: Diagnosis not present

## 2020-06-07 DIAGNOSIS — L03012 Cellulitis of left finger: Secondary | ICD-10-CM | POA: Diagnosis not present

## 2020-06-07 DIAGNOSIS — R21 Rash and other nonspecific skin eruption: Secondary | ICD-10-CM | POA: Diagnosis not present

## 2020-06-15 DIAGNOSIS — N183 Chronic kidney disease, stage 3 unspecified: Secondary | ICD-10-CM | POA: Diagnosis not present

## 2020-06-15 DIAGNOSIS — K219 Gastro-esophageal reflux disease without esophagitis: Secondary | ICD-10-CM | POA: Diagnosis not present

## 2020-06-15 DIAGNOSIS — M810 Age-related osteoporosis without current pathological fracture: Secondary | ICD-10-CM | POA: Diagnosis not present

## 2020-06-15 DIAGNOSIS — J4520 Mild intermittent asthma, uncomplicated: Secondary | ICD-10-CM | POA: Diagnosis not present

## 2020-06-15 DIAGNOSIS — I129 Hypertensive chronic kidney disease with stage 1 through stage 4 chronic kidney disease, or unspecified chronic kidney disease: Secondary | ICD-10-CM | POA: Diagnosis not present

## 2020-06-15 DIAGNOSIS — E785 Hyperlipidemia, unspecified: Secondary | ICD-10-CM | POA: Diagnosis not present

## 2020-06-23 DIAGNOSIS — Z85820 Personal history of malignant melanoma of skin: Secondary | ICD-10-CM | POA: Diagnosis not present

## 2020-06-23 DIAGNOSIS — R21 Rash and other nonspecific skin eruption: Secondary | ICD-10-CM | POA: Diagnosis not present

## 2020-06-23 DIAGNOSIS — J4520 Mild intermittent asthma, uncomplicated: Secondary | ICD-10-CM | POA: Diagnosis not present

## 2020-06-23 DIAGNOSIS — K219 Gastro-esophageal reflux disease without esophagitis: Secondary | ICD-10-CM | POA: Diagnosis not present

## 2020-06-23 DIAGNOSIS — N183 Chronic kidney disease, stage 3 unspecified: Secondary | ICD-10-CM | POA: Diagnosis not present

## 2020-06-23 DIAGNOSIS — Z9181 History of falling: Secondary | ICD-10-CM | POA: Diagnosis not present

## 2020-06-23 DIAGNOSIS — I129 Hypertensive chronic kidney disease with stage 1 through stage 4 chronic kidney disease, or unspecified chronic kidney disease: Secondary | ICD-10-CM | POA: Diagnosis not present

## 2020-06-23 DIAGNOSIS — E7800 Pure hypercholesterolemia, unspecified: Secondary | ICD-10-CM | POA: Diagnosis not present

## 2020-06-23 DIAGNOSIS — R1310 Dysphagia, unspecified: Secondary | ICD-10-CM | POA: Diagnosis not present

## 2020-06-23 DIAGNOSIS — M810 Age-related osteoporosis without current pathological fracture: Secondary | ICD-10-CM | POA: Diagnosis not present

## 2020-06-23 DIAGNOSIS — H9192 Unspecified hearing loss, left ear: Secondary | ICD-10-CM | POA: Diagnosis not present

## 2020-06-29 DIAGNOSIS — H9192 Unspecified hearing loss, left ear: Secondary | ICD-10-CM | POA: Diagnosis not present

## 2020-06-29 DIAGNOSIS — R1310 Dysphagia, unspecified: Secondary | ICD-10-CM | POA: Diagnosis not present

## 2020-06-29 DIAGNOSIS — Z9181 History of falling: Secondary | ICD-10-CM | POA: Diagnosis not present

## 2020-06-29 DIAGNOSIS — N183 Chronic kidney disease, stage 3 unspecified: Secondary | ICD-10-CM | POA: Diagnosis not present

## 2020-06-29 DIAGNOSIS — K219 Gastro-esophageal reflux disease without esophagitis: Secondary | ICD-10-CM | POA: Diagnosis not present

## 2020-06-29 DIAGNOSIS — E7800 Pure hypercholesterolemia, unspecified: Secondary | ICD-10-CM | POA: Diagnosis not present

## 2020-06-29 DIAGNOSIS — Z85820 Personal history of malignant melanoma of skin: Secondary | ICD-10-CM | POA: Diagnosis not present

## 2020-06-29 DIAGNOSIS — J4520 Mild intermittent asthma, uncomplicated: Secondary | ICD-10-CM | POA: Diagnosis not present

## 2020-06-29 DIAGNOSIS — M810 Age-related osteoporosis without current pathological fracture: Secondary | ICD-10-CM | POA: Diagnosis not present

## 2020-06-29 DIAGNOSIS — I129 Hypertensive chronic kidney disease with stage 1 through stage 4 chronic kidney disease, or unspecified chronic kidney disease: Secondary | ICD-10-CM | POA: Diagnosis not present

## 2020-06-29 DIAGNOSIS — R21 Rash and other nonspecific skin eruption: Secondary | ICD-10-CM | POA: Diagnosis not present

## 2020-06-30 DIAGNOSIS — I129 Hypertensive chronic kidney disease with stage 1 through stage 4 chronic kidney disease, or unspecified chronic kidney disease: Secondary | ICD-10-CM | POA: Diagnosis not present

## 2020-06-30 DIAGNOSIS — N183 Chronic kidney disease, stage 3 unspecified: Secondary | ICD-10-CM | POA: Diagnosis not present

## 2020-06-30 DIAGNOSIS — E785 Hyperlipidemia, unspecified: Secondary | ICD-10-CM | POA: Diagnosis not present

## 2020-07-01 DIAGNOSIS — I129 Hypertensive chronic kidney disease with stage 1 through stage 4 chronic kidney disease, or unspecified chronic kidney disease: Secondary | ICD-10-CM | POA: Diagnosis not present

## 2020-07-01 DIAGNOSIS — E7800 Pure hypercholesterolemia, unspecified: Secondary | ICD-10-CM | POA: Diagnosis not present

## 2020-07-01 DIAGNOSIS — R1310 Dysphagia, unspecified: Secondary | ICD-10-CM | POA: Diagnosis not present

## 2020-07-01 DIAGNOSIS — Z85820 Personal history of malignant melanoma of skin: Secondary | ICD-10-CM | POA: Diagnosis not present

## 2020-07-01 DIAGNOSIS — N183 Chronic kidney disease, stage 3 unspecified: Secondary | ICD-10-CM | POA: Diagnosis not present

## 2020-07-01 DIAGNOSIS — J4520 Mild intermittent asthma, uncomplicated: Secondary | ICD-10-CM | POA: Diagnosis not present

## 2020-07-01 DIAGNOSIS — K219 Gastro-esophageal reflux disease without esophagitis: Secondary | ICD-10-CM | POA: Diagnosis not present

## 2020-07-01 DIAGNOSIS — Z9181 History of falling: Secondary | ICD-10-CM | POA: Diagnosis not present

## 2020-07-01 DIAGNOSIS — R21 Rash and other nonspecific skin eruption: Secondary | ICD-10-CM | POA: Diagnosis not present

## 2020-07-01 DIAGNOSIS — M810 Age-related osteoporosis without current pathological fracture: Secondary | ICD-10-CM | POA: Diagnosis not present

## 2020-07-01 DIAGNOSIS — H9192 Unspecified hearing loss, left ear: Secondary | ICD-10-CM | POA: Diagnosis not present

## 2020-07-05 DIAGNOSIS — H9192 Unspecified hearing loss, left ear: Secondary | ICD-10-CM | POA: Diagnosis not present

## 2020-07-05 DIAGNOSIS — Z9181 History of falling: Secondary | ICD-10-CM | POA: Diagnosis not present

## 2020-07-05 DIAGNOSIS — K219 Gastro-esophageal reflux disease without esophagitis: Secondary | ICD-10-CM | POA: Diagnosis not present

## 2020-07-05 DIAGNOSIS — R21 Rash and other nonspecific skin eruption: Secondary | ICD-10-CM | POA: Diagnosis not present

## 2020-07-05 DIAGNOSIS — Z85820 Personal history of malignant melanoma of skin: Secondary | ICD-10-CM | POA: Diagnosis not present

## 2020-07-05 DIAGNOSIS — R1310 Dysphagia, unspecified: Secondary | ICD-10-CM | POA: Diagnosis not present

## 2020-07-05 DIAGNOSIS — M810 Age-related osteoporosis without current pathological fracture: Secondary | ICD-10-CM | POA: Diagnosis not present

## 2020-07-05 DIAGNOSIS — E7800 Pure hypercholesterolemia, unspecified: Secondary | ICD-10-CM | POA: Diagnosis not present

## 2020-07-05 DIAGNOSIS — N183 Chronic kidney disease, stage 3 unspecified: Secondary | ICD-10-CM | POA: Diagnosis not present

## 2020-07-05 DIAGNOSIS — I129 Hypertensive chronic kidney disease with stage 1 through stage 4 chronic kidney disease, or unspecified chronic kidney disease: Secondary | ICD-10-CM | POA: Diagnosis not present

## 2020-07-05 DIAGNOSIS — J4520 Mild intermittent asthma, uncomplicated: Secondary | ICD-10-CM | POA: Diagnosis not present

## 2020-07-07 DIAGNOSIS — J4520 Mild intermittent asthma, uncomplicated: Secondary | ICD-10-CM | POA: Diagnosis not present

## 2020-07-07 DIAGNOSIS — H9192 Unspecified hearing loss, left ear: Secondary | ICD-10-CM | POA: Diagnosis not present

## 2020-07-07 DIAGNOSIS — K219 Gastro-esophageal reflux disease without esophagitis: Secondary | ICD-10-CM | POA: Diagnosis not present

## 2020-07-07 DIAGNOSIS — R1310 Dysphagia, unspecified: Secondary | ICD-10-CM | POA: Diagnosis not present

## 2020-07-07 DIAGNOSIS — Z9181 History of falling: Secondary | ICD-10-CM | POA: Diagnosis not present

## 2020-07-07 DIAGNOSIS — M810 Age-related osteoporosis without current pathological fracture: Secondary | ICD-10-CM | POA: Diagnosis not present

## 2020-07-07 DIAGNOSIS — I129 Hypertensive chronic kidney disease with stage 1 through stage 4 chronic kidney disease, or unspecified chronic kidney disease: Secondary | ICD-10-CM | POA: Diagnosis not present

## 2020-07-07 DIAGNOSIS — E7800 Pure hypercholesterolemia, unspecified: Secondary | ICD-10-CM | POA: Diagnosis not present

## 2020-07-07 DIAGNOSIS — N183 Chronic kidney disease, stage 3 unspecified: Secondary | ICD-10-CM | POA: Diagnosis not present

## 2020-07-07 DIAGNOSIS — R21 Rash and other nonspecific skin eruption: Secondary | ICD-10-CM | POA: Diagnosis not present

## 2020-07-07 DIAGNOSIS — Z85820 Personal history of malignant melanoma of skin: Secondary | ICD-10-CM | POA: Diagnosis not present

## 2020-07-12 ENCOUNTER — Institutional Professional Consult (permissible substitution): Payer: Medicare Other | Admitting: Pulmonary Disease

## 2020-07-12 DIAGNOSIS — R1310 Dysphagia, unspecified: Secondary | ICD-10-CM | POA: Diagnosis not present

## 2020-07-12 DIAGNOSIS — I129 Hypertensive chronic kidney disease with stage 1 through stage 4 chronic kidney disease, or unspecified chronic kidney disease: Secondary | ICD-10-CM | POA: Diagnosis not present

## 2020-07-12 DIAGNOSIS — H9192 Unspecified hearing loss, left ear: Secondary | ICD-10-CM | POA: Diagnosis not present

## 2020-07-12 DIAGNOSIS — J4520 Mild intermittent asthma, uncomplicated: Secondary | ICD-10-CM | POA: Diagnosis not present

## 2020-07-12 DIAGNOSIS — N183 Chronic kidney disease, stage 3 unspecified: Secondary | ICD-10-CM | POA: Diagnosis not present

## 2020-07-12 DIAGNOSIS — R21 Rash and other nonspecific skin eruption: Secondary | ICD-10-CM | POA: Diagnosis not present

## 2020-07-12 DIAGNOSIS — M810 Age-related osteoporosis without current pathological fracture: Secondary | ICD-10-CM | POA: Diagnosis not present

## 2020-07-12 DIAGNOSIS — Z85820 Personal history of malignant melanoma of skin: Secondary | ICD-10-CM | POA: Diagnosis not present

## 2020-07-12 DIAGNOSIS — E7800 Pure hypercholesterolemia, unspecified: Secondary | ICD-10-CM | POA: Diagnosis not present

## 2020-07-12 DIAGNOSIS — K219 Gastro-esophageal reflux disease without esophagitis: Secondary | ICD-10-CM | POA: Diagnosis not present

## 2020-07-12 DIAGNOSIS — Z9181 History of falling: Secondary | ICD-10-CM | POA: Diagnosis not present

## 2020-07-13 DIAGNOSIS — J4520 Mild intermittent asthma, uncomplicated: Secondary | ICD-10-CM | POA: Diagnosis not present

## 2020-07-13 DIAGNOSIS — I129 Hypertensive chronic kidney disease with stage 1 through stage 4 chronic kidney disease, or unspecified chronic kidney disease: Secondary | ICD-10-CM | POA: Diagnosis not present

## 2020-07-13 DIAGNOSIS — M810 Age-related osteoporosis without current pathological fracture: Secondary | ICD-10-CM | POA: Diagnosis not present

## 2020-07-13 DIAGNOSIS — K219 Gastro-esophageal reflux disease without esophagitis: Secondary | ICD-10-CM | POA: Diagnosis not present

## 2020-07-13 DIAGNOSIS — E785 Hyperlipidemia, unspecified: Secondary | ICD-10-CM | POA: Diagnosis not present

## 2020-07-13 DIAGNOSIS — N183 Chronic kidney disease, stage 3 unspecified: Secondary | ICD-10-CM | POA: Diagnosis not present

## 2020-08-03 DIAGNOSIS — L03031 Cellulitis of right toe: Secondary | ICD-10-CM | POA: Diagnosis not present

## 2020-08-03 DIAGNOSIS — I129 Hypertensive chronic kidney disease with stage 1 through stage 4 chronic kidney disease, or unspecified chronic kidney disease: Secondary | ICD-10-CM | POA: Diagnosis not present

## 2020-08-03 DIAGNOSIS — J4520 Mild intermittent asthma, uncomplicated: Secondary | ICD-10-CM | POA: Diagnosis not present

## 2020-08-03 DIAGNOSIS — N183 Chronic kidney disease, stage 3 unspecified: Secondary | ICD-10-CM | POA: Diagnosis not present

## 2020-08-03 DIAGNOSIS — R1310 Dysphagia, unspecified: Secondary | ICD-10-CM | POA: Diagnosis not present

## 2020-08-03 DIAGNOSIS — R21 Rash and other nonspecific skin eruption: Secondary | ICD-10-CM | POA: Diagnosis not present

## 2020-08-03 DIAGNOSIS — Z85820 Personal history of malignant melanoma of skin: Secondary | ICD-10-CM | POA: Diagnosis not present

## 2020-08-03 DIAGNOSIS — M810 Age-related osteoporosis without current pathological fracture: Secondary | ICD-10-CM | POA: Diagnosis not present

## 2020-08-03 DIAGNOSIS — E7800 Pure hypercholesterolemia, unspecified: Secondary | ICD-10-CM | POA: Diagnosis not present

## 2020-08-03 DIAGNOSIS — Z9181 History of falling: Secondary | ICD-10-CM | POA: Diagnosis not present

## 2020-08-03 DIAGNOSIS — K219 Gastro-esophageal reflux disease without esophagitis: Secondary | ICD-10-CM | POA: Diagnosis not present

## 2020-08-03 DIAGNOSIS — H9192 Unspecified hearing loss, left ear: Secondary | ICD-10-CM | POA: Diagnosis not present

## 2020-08-06 DIAGNOSIS — J4520 Mild intermittent asthma, uncomplicated: Secondary | ICD-10-CM | POA: Diagnosis not present

## 2020-08-06 DIAGNOSIS — K219 Gastro-esophageal reflux disease without esophagitis: Secondary | ICD-10-CM | POA: Diagnosis not present

## 2020-08-06 DIAGNOSIS — M810 Age-related osteoporosis without current pathological fracture: Secondary | ICD-10-CM | POA: Diagnosis not present

## 2020-08-06 DIAGNOSIS — I129 Hypertensive chronic kidney disease with stage 1 through stage 4 chronic kidney disease, or unspecified chronic kidney disease: Secondary | ICD-10-CM | POA: Diagnosis not present

## 2020-08-06 DIAGNOSIS — Z85820 Personal history of malignant melanoma of skin: Secondary | ICD-10-CM | POA: Diagnosis not present

## 2020-08-06 DIAGNOSIS — R21 Rash and other nonspecific skin eruption: Secondary | ICD-10-CM | POA: Diagnosis not present

## 2020-08-06 DIAGNOSIS — E7800 Pure hypercholesterolemia, unspecified: Secondary | ICD-10-CM | POA: Diagnosis not present

## 2020-08-06 DIAGNOSIS — Z9181 History of falling: Secondary | ICD-10-CM | POA: Diagnosis not present

## 2020-08-06 DIAGNOSIS — N183 Chronic kidney disease, stage 3 unspecified: Secondary | ICD-10-CM | POA: Diagnosis not present

## 2020-08-06 DIAGNOSIS — R1310 Dysphagia, unspecified: Secondary | ICD-10-CM | POA: Diagnosis not present

## 2020-08-06 DIAGNOSIS — H9192 Unspecified hearing loss, left ear: Secondary | ICD-10-CM | POA: Diagnosis not present

## 2020-08-10 ENCOUNTER — Institutional Professional Consult (permissible substitution): Payer: Medicare Other | Admitting: Pulmonary Disease

## 2020-08-10 DIAGNOSIS — Z9181 History of falling: Secondary | ICD-10-CM | POA: Diagnosis not present

## 2020-08-10 DIAGNOSIS — N183 Chronic kidney disease, stage 3 unspecified: Secondary | ICD-10-CM | POA: Diagnosis not present

## 2020-08-10 DIAGNOSIS — J4520 Mild intermittent asthma, uncomplicated: Secondary | ICD-10-CM | POA: Diagnosis not present

## 2020-08-10 DIAGNOSIS — M810 Age-related osteoporosis without current pathological fracture: Secondary | ICD-10-CM | POA: Diagnosis not present

## 2020-08-10 DIAGNOSIS — I129 Hypertensive chronic kidney disease with stage 1 through stage 4 chronic kidney disease, or unspecified chronic kidney disease: Secondary | ICD-10-CM | POA: Diagnosis not present

## 2020-08-10 DIAGNOSIS — R1310 Dysphagia, unspecified: Secondary | ICD-10-CM | POA: Diagnosis not present

## 2020-08-10 DIAGNOSIS — Z85820 Personal history of malignant melanoma of skin: Secondary | ICD-10-CM | POA: Diagnosis not present

## 2020-08-10 DIAGNOSIS — K219 Gastro-esophageal reflux disease without esophagitis: Secondary | ICD-10-CM | POA: Diagnosis not present

## 2020-08-10 DIAGNOSIS — E7800 Pure hypercholesterolemia, unspecified: Secondary | ICD-10-CM | POA: Diagnosis not present

## 2020-08-10 DIAGNOSIS — R21 Rash and other nonspecific skin eruption: Secondary | ICD-10-CM | POA: Diagnosis not present

## 2020-08-10 DIAGNOSIS — H9192 Unspecified hearing loss, left ear: Secondary | ICD-10-CM | POA: Diagnosis not present

## 2020-08-11 DIAGNOSIS — I129 Hypertensive chronic kidney disease with stage 1 through stage 4 chronic kidney disease, or unspecified chronic kidney disease: Secondary | ICD-10-CM | POA: Diagnosis not present

## 2020-08-11 DIAGNOSIS — N183 Chronic kidney disease, stage 3 unspecified: Secondary | ICD-10-CM | POA: Diagnosis not present

## 2020-08-11 DIAGNOSIS — E785 Hyperlipidemia, unspecified: Secondary | ICD-10-CM | POA: Diagnosis not present

## 2020-08-11 DIAGNOSIS — J4520 Mild intermittent asthma, uncomplicated: Secondary | ICD-10-CM | POA: Diagnosis not present

## 2020-08-11 DIAGNOSIS — M810 Age-related osteoporosis without current pathological fracture: Secondary | ICD-10-CM | POA: Diagnosis not present

## 2020-08-11 DIAGNOSIS — K219 Gastro-esophageal reflux disease without esophagitis: Secondary | ICD-10-CM | POA: Diagnosis not present

## 2020-08-12 DIAGNOSIS — Z85820 Personal history of malignant melanoma of skin: Secondary | ICD-10-CM | POA: Diagnosis not present

## 2020-08-12 DIAGNOSIS — M810 Age-related osteoporosis without current pathological fracture: Secondary | ICD-10-CM | POA: Diagnosis not present

## 2020-08-12 DIAGNOSIS — N183 Chronic kidney disease, stage 3 unspecified: Secondary | ICD-10-CM | POA: Diagnosis not present

## 2020-08-12 DIAGNOSIS — J4520 Mild intermittent asthma, uncomplicated: Secondary | ICD-10-CM | POA: Diagnosis not present

## 2020-08-12 DIAGNOSIS — I129 Hypertensive chronic kidney disease with stage 1 through stage 4 chronic kidney disease, or unspecified chronic kidney disease: Secondary | ICD-10-CM | POA: Diagnosis not present

## 2020-08-12 DIAGNOSIS — Z9181 History of falling: Secondary | ICD-10-CM | POA: Diagnosis not present

## 2020-08-12 DIAGNOSIS — R21 Rash and other nonspecific skin eruption: Secondary | ICD-10-CM | POA: Diagnosis not present

## 2020-08-12 DIAGNOSIS — K219 Gastro-esophageal reflux disease without esophagitis: Secondary | ICD-10-CM | POA: Diagnosis not present

## 2020-08-12 DIAGNOSIS — H9192 Unspecified hearing loss, left ear: Secondary | ICD-10-CM | POA: Diagnosis not present

## 2020-08-12 DIAGNOSIS — E7800 Pure hypercholesterolemia, unspecified: Secondary | ICD-10-CM | POA: Diagnosis not present

## 2020-08-12 DIAGNOSIS — R1310 Dysphagia, unspecified: Secondary | ICD-10-CM | POA: Diagnosis not present

## 2020-08-16 DIAGNOSIS — L03031 Cellulitis of right toe: Secondary | ICD-10-CM | POA: Diagnosis not present

## 2020-08-17 DIAGNOSIS — M810 Age-related osteoporosis without current pathological fracture: Secondary | ICD-10-CM | POA: Diagnosis not present

## 2020-08-17 DIAGNOSIS — Z9181 History of falling: Secondary | ICD-10-CM | POA: Diagnosis not present

## 2020-08-17 DIAGNOSIS — H9192 Unspecified hearing loss, left ear: Secondary | ICD-10-CM | POA: Diagnosis not present

## 2020-08-17 DIAGNOSIS — K219 Gastro-esophageal reflux disease without esophagitis: Secondary | ICD-10-CM | POA: Diagnosis not present

## 2020-08-17 DIAGNOSIS — E7800 Pure hypercholesterolemia, unspecified: Secondary | ICD-10-CM | POA: Diagnosis not present

## 2020-08-17 DIAGNOSIS — N183 Chronic kidney disease, stage 3 unspecified: Secondary | ICD-10-CM | POA: Diagnosis not present

## 2020-08-17 DIAGNOSIS — R1310 Dysphagia, unspecified: Secondary | ICD-10-CM | POA: Diagnosis not present

## 2020-08-17 DIAGNOSIS — R21 Rash and other nonspecific skin eruption: Secondary | ICD-10-CM | POA: Diagnosis not present

## 2020-08-17 DIAGNOSIS — I129 Hypertensive chronic kidney disease with stage 1 through stage 4 chronic kidney disease, or unspecified chronic kidney disease: Secondary | ICD-10-CM | POA: Diagnosis not present

## 2020-08-17 DIAGNOSIS — J4520 Mild intermittent asthma, uncomplicated: Secondary | ICD-10-CM | POA: Diagnosis not present

## 2020-08-17 DIAGNOSIS — Z85820 Personal history of malignant melanoma of skin: Secondary | ICD-10-CM | POA: Diagnosis not present

## 2020-08-19 DIAGNOSIS — I129 Hypertensive chronic kidney disease with stage 1 through stage 4 chronic kidney disease, or unspecified chronic kidney disease: Secondary | ICD-10-CM | POA: Diagnosis not present

## 2020-08-19 DIAGNOSIS — R1310 Dysphagia, unspecified: Secondary | ICD-10-CM | POA: Diagnosis not present

## 2020-08-19 DIAGNOSIS — Z9181 History of falling: Secondary | ICD-10-CM | POA: Diagnosis not present

## 2020-08-19 DIAGNOSIS — M810 Age-related osteoporosis without current pathological fracture: Secondary | ICD-10-CM | POA: Diagnosis not present

## 2020-08-19 DIAGNOSIS — R21 Rash and other nonspecific skin eruption: Secondary | ICD-10-CM | POA: Diagnosis not present

## 2020-08-19 DIAGNOSIS — K219 Gastro-esophageal reflux disease without esophagitis: Secondary | ICD-10-CM | POA: Diagnosis not present

## 2020-08-19 DIAGNOSIS — E7800 Pure hypercholesterolemia, unspecified: Secondary | ICD-10-CM | POA: Diagnosis not present

## 2020-08-19 DIAGNOSIS — Z85820 Personal history of malignant melanoma of skin: Secondary | ICD-10-CM | POA: Diagnosis not present

## 2020-08-19 DIAGNOSIS — J4520 Mild intermittent asthma, uncomplicated: Secondary | ICD-10-CM | POA: Diagnosis not present

## 2020-08-19 DIAGNOSIS — N183 Chronic kidney disease, stage 3 unspecified: Secondary | ICD-10-CM | POA: Diagnosis not present

## 2020-08-19 DIAGNOSIS — H9192 Unspecified hearing loss, left ear: Secondary | ICD-10-CM | POA: Diagnosis not present

## 2020-08-20 ENCOUNTER — Ambulatory Visit: Payer: Medicare Other | Admitting: Podiatry

## 2020-08-27 ENCOUNTER — Other Ambulatory Visit: Payer: Self-pay

## 2020-08-27 ENCOUNTER — Ambulatory Visit: Payer: Medicare Other | Admitting: Pulmonary Disease

## 2020-08-27 ENCOUNTER — Encounter: Payer: Self-pay | Admitting: Pulmonary Disease

## 2020-08-27 DIAGNOSIS — R1319 Other dysphagia: Secondary | ICD-10-CM

## 2020-08-27 DIAGNOSIS — J4520 Mild intermittent asthma, uncomplicated: Secondary | ICD-10-CM

## 2020-08-27 DIAGNOSIS — J45909 Unspecified asthma, uncomplicated: Secondary | ICD-10-CM | POA: Insufficient documentation

## 2020-08-27 DIAGNOSIS — R1310 Dysphagia, unspecified: Secondary | ICD-10-CM | POA: Insufficient documentation

## 2020-08-27 NOTE — Progress Notes (Signed)
Subjective:    Patient ID: Kristen Mclaughlin, female    DOB: [birth year], 87 y.o.   MRN: [id number]  HPI  Kristen Mclaughlin is an 87 year old never smoker who presents for evaluation of asthma. She arrives accompanied by her daughter Kristen Mclaughlin today. [name] has mild to moderate dementia and is getting ready to move in with her daughter.  She has significant deconditioning and underwent home PT program.  She has decreased mobility and problems with recent memory. Kristen Mclaughlin reports a history of asthma when she was in her 21s.  This has not bothered her for the past 25 to 30 years.  She had an ED visit for dry cough and shortness of breath 3/22, chest x-ray which I independently reviewed shows clear lungs, she was given IV Solu-Medrol and albuterol nebs with improvement and discharged on a prednisone taper she was also given albuterol MDI with a spacer and albuterol neb which she uses once daily.  Breathing is improved and baby has not noted wheezing in recent weeks.  Cough is resolved  She reports occasional feeling of food getting stuck in her chest, there is no episode of coughing while eating  PMH -dementia, hypertension, CKD stage III  Significant tests/ events reviewed  Esophagram 01/2019 moderate GE reflux, mild esophageal dysmotility, borderline peptic stricture in the lower esophagus  Past Medical History:  Diagnosis Date  . Anxiety   . Asthma   . CKD (chronic kidney disease), stage III (Kristen Mclaughlin)   . Dementia (Kristen Mclaughlin)   . Dysphagia   . Hearing loss    left ear deaf, heairng aid on right  . Hyperlipidemia   . Hypertension   . MCI (mild cognitive impairment)   . Melanoma (Kristen Mclaughlin)    hx of facial  . Memory loss   . Osteoporosis   . Vitamin D deficiency    Past Surgical History:  Procedure Laterality Date  . APPENDECTOMY    . CATARACT EXTRACTION    . KNEE SURGERY     right    Allergies  Allergen Reactions  . Alendronate     Blurry vision  . Boniva [Ibandronic Acid]     Body aches  .  Colestipol     Visual changes  . Donepezil Diarrhea  . Lipitor [Atorvastatin]     Body aches  . Niacin And Related     aches  . Pravastatin     Blurry vision  . Rivastigmine     Balance difficulty  . Rosuvastatin     Body aches  . Welchol [Colesevelam]     Blurry vision    Social History   Socioeconomic History  . Marital status: Married    Spouse name: Kristen Mclaughlin  . Number of children: 2  . Years of education: Not on file  . Highest education level: Some college, no degree  Occupational History  . Occupation: Retired    Comment: office work  Tobacco Use  . Smoking status: Never Smoker  . Smokeless tobacco: Never Used  Substance and Sexual Activity  . Alcohol use: No    Alcohol/week: 0.0 standard drinks  . Drug use: No  . Sexual activity: Not on file  Other Topics Concern  . Not on file  Social History Narrative   07/23/19 lives at home with spouse   Social Determinants of Health   Financial Resource Strain: Not on file  Food Insecurity: Not on file  Transportation Needs: Not on file  Physical Activity: Not on file  Stress: Not on file  Social Connections: Not on file  Intimate Partner Violence: Not on file    Family History  Problem Relation Age of Onset  . Alzheimer's disease Mother   . Asthma Mother   . Alzheimer's disease Sister   . Asthma Sister      Review of Systems Constitutional: negative for anorexia, fevers and sweats  Eyes: negative for irritation, redness and visual disturbance  Ears, nose, mouth, throat, and face: negative for earaches, epistaxis, nasal congestion and sore throat  Respiratory: negative for cough,  sputum and wheezing  Cardiovascular: negative for chest pain,  lower extremity edema, orthopnea, palpitations and syncope  Gastrointestinal: negative for abdominal pain, constipation, diarrhea, melena, nausea and vomiting  Genitourinary:negative for dysuria, frequency and hematuria  Hematologic/lymphatic: negative for bleeding,  easy bruising and lymphadenopathy  Musculoskeletal:negative for arthralgias, muscle weakness and stiff joints  Neurological: negative for coordination problems, gait problems, headaches and weakness  Endocrine: negative for diabetic symptoms including polydipsia, polyuria and weight loss     Objective:   Physical Exam  Gen. Pleasant, elderly,well-nourished, in no distress, flat affect ENT - no pallor,icterus, no post nasal drip Neck: No JVD, no thyromegaly, no carotid bruits Lungs: no use of accessory muscles, no dullness to percussion, LT basal dry  rales no rhonchi  Cardiovascular: Rhythm regular, heart sounds  normal, no murmurs or gallops, no peripheral edema Abdomen: soft and non-tender, no hepatosplenomegaly, BS normal. Musculoskeletal: No deformities, no cyanosis or clubbing Neuro:  alert, non focal       Assessment & Plan:

## 2020-08-27 NOTE — Patient Instructions (Signed)
Use albuterol MDI or nebs only on an as-needed basis Shortness of breath could have been due to asthma, allergies or Aspiration episode  Please make follow-up appointment if symptoms get worse

## 2020-08-27 NOTE — Assessment & Plan Note (Addendum)
I am not convinced that her ED visit was related to asthma.  She has dementia and would be unable to perform a spirometry maneuver so I will hold off on further testing. Differential diagnosis includes asthma, allergies or possible aspiration episode. Her symptoms seem well controlled on her current regimen of albuterol once daily. She does have an albuterol nebulizer to use as needed we discussed that she can use her albuterol on a as needed basis.  She also has a spacer that she can use with the MDI She has fine dry rales at the left base on exam but chest x-ray is clear and do not feel that we need to investigate for ILD here

## 2020-08-27 NOTE — Assessment & Plan Note (Signed)
Seems to be esophageal phase, esophagram has shown peptic stricture in the distal esophagus.  Doubt we need to intervene unless symptoms get worse

## 2020-09-02 ENCOUNTER — Other Ambulatory Visit: Payer: Self-pay

## 2020-09-02 ENCOUNTER — Telehealth: Payer: Self-pay | Admitting: *Deleted

## 2020-09-02 ENCOUNTER — Encounter: Payer: Self-pay | Admitting: Podiatry

## 2020-09-02 ENCOUNTER — Ambulatory Visit: Payer: Medicare Other | Admitting: Podiatry

## 2020-09-02 DIAGNOSIS — L600 Ingrowing nail: Secondary | ICD-10-CM | POA: Diagnosis not present

## 2020-09-02 DIAGNOSIS — F419 Anxiety disorder, unspecified: Secondary | ICD-10-CM | POA: Insufficient documentation

## 2020-09-02 DIAGNOSIS — Z85820 Personal history of malignant melanoma of skin: Secondary | ICD-10-CM | POA: Insufficient documentation

## 2020-09-02 DIAGNOSIS — I129 Hypertensive chronic kidney disease with stage 1 through stage 4 chronic kidney disease, or unspecified chronic kidney disease: Secondary | ICD-10-CM | POA: Insufficient documentation

## 2020-09-02 DIAGNOSIS — F324 Major depressive disorder, single episode, in partial remission: Secondary | ICD-10-CM | POA: Insufficient documentation

## 2020-09-02 DIAGNOSIS — K219 Gastro-esophageal reflux disease without esophagitis: Secondary | ICD-10-CM | POA: Insufficient documentation

## 2020-09-02 DIAGNOSIS — J301 Allergic rhinitis due to pollen: Secondary | ICD-10-CM | POA: Insufficient documentation

## 2020-09-02 DIAGNOSIS — E559 Vitamin D deficiency, unspecified: Secondary | ICD-10-CM | POA: Insufficient documentation

## 2020-09-02 DIAGNOSIS — M810 Age-related osteoporosis without current pathological fracture: Secondary | ICD-10-CM | POA: Insufficient documentation

## 2020-09-02 DIAGNOSIS — J309 Allergic rhinitis, unspecified: Secondary | ICD-10-CM | POA: Insufficient documentation

## 2020-09-02 DIAGNOSIS — J4520 Mild intermittent asthma, uncomplicated: Secondary | ICD-10-CM | POA: Insufficient documentation

## 2020-09-02 DIAGNOSIS — F331 Major depressive disorder, recurrent, moderate: Secondary | ICD-10-CM | POA: Insufficient documentation

## 2020-09-02 DIAGNOSIS — R296 Repeated falls: Secondary | ICD-10-CM | POA: Insufficient documentation

## 2020-09-02 MED ORDER — CEFADROXIL 500 MG PO CAPS: 500.0000 mg | ORAL_CAPSULE | Freq: Two times a day (BID) | ORAL | 0 refills | Status: AC

## 2020-09-02 NOTE — Patient Instructions (Signed)

## 2020-09-02 NOTE — Progress Notes (Signed)
  Subjective:  Patient ID: MCKENZE SLONE, female    DOB: 05/03/1932,  MRN: [id number]  Chief Complaint  Patient presents with   Toe Pain      (np) infection of right great toe     87 y.o. female presents with the above complaint. History confirmed with patient.  Here with her daughter.  She took 1 round of oral antibiotics.  They did using peroxide and Neosporin is not getting better.  Objective:  Physical Exam: warm, good capillary refill, no trophic changes or ulcerative lesions, normal DP and PT pulses, and normal sensory exam.  Right hallux paronychia Assessment:   1. Ingrowing right great toenail      Plan:  Patient was evaluated and treated and all questions answered.    Ingrown Nail, right -Patient elects to proceed with minor surgery to remove ingrown toenail today. Consent reviewed and signed by patient. -Ingrown nail excised. See procedure note. -Educated on post-procedure care including soaking. Written instructions provided and reviewed. -Discussed if recurs would proceed with partial permanent nail avulsion with matricectomy Procedure: Excision of Ingrown Toenail Location: Right 1st toe  nail . Anesthesia: Lidocaine 1% plain; 1.5 mL and Marcaine 0.5% plain; 1.5 mL, digital block. Skin Prep: Betadine. Dressing: Silvadene; telfa; dry, sterile, compression dressing. Technique: Following skin prep, the toe was exsanguinated and a tourniquet was secured at the base of the toe. The affected nail border was freed, split with a nail splitter, and excised. The tourniquet was then removed and sterile dressing applied. Disposition: Patient tolerated procedure well.    Return if symptoms worsen or fail to improve.

## 2020-09-02 NOTE — Telephone Encounter (Addendum)
Patient's daughter ([name])is calling for status of an antibiotic that was supposed to be sent.   Called [facility] and they said they will deliver tomorrow. I returned call to daughter and explained but she said that she had wanted it sent to [facility] instead because it takes a few days for them to receive.

## 2020-09-03 NOTE — Telephone Encounter (Signed)
Returned call to daughter to give information per Dr Sherryle Lis, said that she will wait until lunch time to see if they will deliver and will call us back if we need to cancel and resend to [facility].

## 2020-09-03 NOTE — Telephone Encounter (Signed)
Returned call to patient's daughter and she said that the medicine had been delivered 09/03/20.

## 2020-09-09 DIAGNOSIS — K219 Gastro-esophageal reflux disease without esophagitis: Secondary | ICD-10-CM | POA: Diagnosis not present

## 2020-09-09 DIAGNOSIS — I129 Hypertensive chronic kidney disease with stage 1 through stage 4 chronic kidney disease, or unspecified chronic kidney disease: Secondary | ICD-10-CM | POA: Diagnosis not present

## 2020-09-09 DIAGNOSIS — M810 Age-related osteoporosis without current pathological fracture: Secondary | ICD-10-CM | POA: Diagnosis not present

## 2020-09-09 DIAGNOSIS — J4520 Mild intermittent asthma, uncomplicated: Secondary | ICD-10-CM | POA: Diagnosis not present

## 2020-09-09 DIAGNOSIS — E785 Hyperlipidemia, unspecified: Secondary | ICD-10-CM | POA: Diagnosis not present

## 2020-10-13 DIAGNOSIS — M810 Age-related osteoporosis without current pathological fracture: Secondary | ICD-10-CM | POA: Diagnosis not present

## 2020-10-13 DIAGNOSIS — N183 Chronic kidney disease, stage 3 unspecified: Secondary | ICD-10-CM | POA: Diagnosis not present

## 2020-10-13 DIAGNOSIS — E785 Hyperlipidemia, unspecified: Secondary | ICD-10-CM | POA: Diagnosis not present

## 2020-10-13 DIAGNOSIS — I129 Hypertensive chronic kidney disease with stage 1 through stage 4 chronic kidney disease, or unspecified chronic kidney disease: Secondary | ICD-10-CM | POA: Diagnosis not present

## 2020-10-13 DIAGNOSIS — K219 Gastro-esophageal reflux disease without esophagitis: Secondary | ICD-10-CM | POA: Diagnosis not present

## 2020-10-13 DIAGNOSIS — J4520 Mild intermittent asthma, uncomplicated: Secondary | ICD-10-CM | POA: Diagnosis not present

## 2020-11-09 ENCOUNTER — Ambulatory Visit: Payer: Medicare Other | Admitting: Diagnostic Neuroimaging

## 2020-11-09 ENCOUNTER — Encounter: Payer: Self-pay | Admitting: Diagnostic Neuroimaging

## 2020-11-09 VITALS — BP 122/70 | HR 70 | Ht 60.0 in | Wt 131.2 lb

## 2020-11-09 DIAGNOSIS — F0391 Unspecified dementia with behavioral disturbance: Secondary | ICD-10-CM

## 2020-11-09 DIAGNOSIS — F03B18 Unspecified dementia, moderate, with other behavioral disturbance: Secondary | ICD-10-CM

## 2020-11-09 NOTE — Progress Notes (Signed)
[facility]  PATIENT: Kristen Mclaughlin DOB: October 27, 1932  REFERRING CLINICIAN: Harlan Stains, MD HISTORY FROM: patient  REASON FOR VISIT: follow up   HISTORICAL  CHIEF COMPLAINT:  Chief Complaint  Patient presents with   Follow-up    Rm 7 with daughter. Reports memory has worsened over the last year and daughter also reports sx of mini stroke ( eye drooping in the left eye off an on). Reports 3 falls over the last year but no serious injuries.     HISTORY OF PRESENT ILLNESS:   UPDATE (11/09/20, VRP): Since last visit, doing poorly. More memory loss and decline. More fatigue and sleep. Does not recognize her daughter sometimes. 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 Patient planning to move in with daughter soon.  PRIOR HPI (226): 87 year old female here for evaluation of dementia.  Symptoms started in 2019, gradual onset short-term memory loss, getting lost, difficulty with medications, difficulty with functioning independently outside the home.  She was diagnosed with dementia and started on donepezil.  Patient had some side effects and now is on memantine.  Symptoms are progressive over time.  Patient has limited insight into these problems.  She has not been driving.  Family helps with most of household chores.  She has been more depressed, staying in bed up to 20 hours/day.   REVIEW OF SYSTEMS: Full 14 system review of systems performed and negative with exception of: As per HPI.  ALLERGIES: Allergies  Allergen Reactions   Alendronate     Blurry vision   Boniva [Ibandronic Acid]     Body aches   Bupropion Other (See Comments)   Colestipol     Visual changes   Colestipol Hcl Other (See Comments)   Donepezil Diarrhea   Doxycycline Other (See Comments)   Lipitor [Atorvastatin]     Body aches   Niacin And Related     aches   Pravastatin     Blurry vision   Rivastigmine     Balance difficulty   Rosuvastatin     Body aches   Welchol  [Colesevelam]     Blurry vision    HOME MEDICATIONS: Outpatient Medications Prior to Visit  Medication Sig Dispense Refill   albuterol (PROVENTIL) (5 MG/ML) 0.5% nebulizer solution Take 0.5 mLs (2.5 mg total) by nebulization every 6 (six) hours as needed for wheezing or shortness of breath. 20 mL 0   aspirin 81 MG tablet Take 81 mg by mouth daily.     cetirizine (ZYRTEC) 10 MG tablet 1 tablet     Cholecalciferol (VITAMIN D3) 125 MCG (5000 UT) CAPS 1 capsule     diclofenac sodium (VOLTAREN) 1 % GEL Apply topically. As Needed     escitalopram (LEXAPRO) 10 MG tablet TK 1 T PO QD  1   Ferrous Sulfate (IRON) 325 (65 Fe) MG TABS 1 tablet ('65mg'$ )     fluticasone (FLONASE) 50 MCG/ACT nasal spray Place into both nostrils daily.     hydrochlorothiazide (HYDRODIURIL) 12.5 MG tablet Take 12.5 mg by mouth every morning.     losartan (COZAAR) 50 MG tablet Take 1 tablet by mouth every morning.     memantine (NAMENDA) 10 MG tablet Take 1 tablet (10 mg total) by mouth 2 (two) times daily. 60 tablet 12   pantoprazole (PROTONIX) 40 MG tablet Take 1 tablet by mouth every morning.     simvastatin (ZOCOR) 40 MG tablet Take 40 mg by mouth daily.     triamcinolone cream (  KENALOG) 0.1 % APPLY EXTERNALLY TO THE AFFECTED AREA TWICE DAILY AS NEEDED     Vitamin D, Ergocalciferol, (DRISDOL) 50000 UNITS CAPS capsule Take 50,000 Units by mouth. 1 capsule 1-2 times a week     Vitamin E 200 units TABS 1 capsule     vitamin E 400 UNIT capsule Take 400 Units by mouth daily.     cefadroxil (DURICEF) 500 MG capsule Take 1 capsule (500 mg total) by mouth 2 (two) times daily. (Patient not taking: Reported on 11/09/2020) 10 capsule 0   Coenzyme Q10 (CO Q-10) 200 MG CAPS Take 200 mg by mouth daily. (Patient not taking: Reported on 11/09/2020)     pantoprazole (PROTONIX) 40 MG tablet Take 40 mg by mouth daily. (Patient not taking: Reported on 11/09/2020)     losartan-hydrochlorothiazide (HYZAAR) 50-12.5 MG tablet Take 1 tablet by mouth  daily. (Patient not taking: Reported on 11/09/2020)     No facility-administered medications prior to visit.    PAST MEDICAL HISTORY: Past Medical History:  Diagnosis Date   Anxiety    Asthma    CKD (chronic kidney disease), stage III (HCC)    Dementia (HCC)    Dysphagia    Hearing loss    left ear deaf, heairng aid on right   Hyperlipidemia    Hypertension    MCI (mild cognitive impairment)    Melanoma (HCC)    hx of facial   Memory loss    Osteoporosis    Vitamin D deficiency     PAST SURGICAL HISTORY: Past Surgical History:  Procedure Laterality Date   APPENDECTOMY     CATARACT EXTRACTION     KNEE SURGERY     right    FAMILY HISTORY: Family History  Problem Relation Age of Onset   Alzheimer's disease Mother    Asthma Mother    Alzheimer's disease Sister    Asthma Sister     SOCIAL HISTORY: Social History   Socioeconomic History   Marital status: Married    Spouse name: Marcello Moores   Number of children: 2   Years of education: Not on file   Highest education level: Some college, no degree  Occupational History   Occupation: Retired    Comment: office work  Tobacco Use   Smoking status: Never   Smokeless tobacco: Never  Substance and Sexual Activity   Alcohol use: No    Alcohol/week: 0.0 standard drinks   Drug use: No   Sexual activity: Not on file  Other Topics Concern   Not on file  Social History Narrative   07/23/19 lives at home with spouse   Social Determinants of Health   Financial Resource Strain: Not on file  Food Insecurity: Not on file  Transportation Needs: Not on file  Physical Activity: Not on file  Stress: Not on file  Social Connections: Not on file  Intimate Partner Violence: Not on file     PHYSICAL EXAM  GENERAL EXAM/CONSTITUTIONAL: Vitals:  Vitals:   11/09/20 1110  BP: 122/70  Pulse: 70  SpO2: 97%  Weight: 131 lb 4 oz (59.5 kg)  Height: 5' (1.524 m)    Body mass index is 25.63 kg/m. Wt Readings from Last 3  Encounters:  11/09/20 131 lb 4 oz (59.5 kg)  08/27/20 131 lb (59.4 kg)  05/30/20 140 lb (63.5 kg)   Patient is in no distress; well developed, nourished and groomed; neck is supple  CARDIOVASCULAR: Examination of carotid arteries is normal; no carotid bruits Regular rate  and rhythm, no murmurs Examination of peripheral vascular system by observation and palpation is normal  EYES: Ophthalmoscopic exam of optic discs and posterior segments is normal; no papilledema or hemorrhages No results found.  MUSCULOSKELETAL: Gait, strength, tone, movements noted in Neurologic exam below  NEUROLOGIC: MENTAL STATUS:  MMSE - Mini Mental State Exam 11/09/2020 07/23/2019 11/27/2017  Orientation to time '1 2 5  '$ Orientation to Place '1 4 5  '$ Registration '3 3 3  '$ Attention/ Calculation 0 1 5  Recall 0 1 2  Language- name 2 objects 0 2 2  Language- repeat 1 0 1  Language- follow 3 step command '3 3 3  '$ Language- read & follow direction '1 1 1  '$ Write a sentence '1 1 1  '$ Copy design 0 0 1  Total score '11 18 29   '$ Awake BUT SLEEPY DECR memory DECR attention and concentration language fluent, comprehension intact, naming intact fund of knowledge appropriate  CRANIAL NERVE:  2nd - no papilledema on fundoscopic exam 2nd, 3rd, 4th, 6th - pupils equal and reactive to light, visual fields full to confrontation, extraocular muscles intact, no nystagmus 5th - facial sensation symmetric 7th - facial strength symmetric 8th - hearing intact 9th - palate elevates symmetrically, uvula midline 11th - shoulder shrug symmetric 12th - tongue protrusion midline  MOTOR:  normal bulk and tone, full strength in the BUE, BLE  SENSORY:  normal and symmetric to light touch  COORDINATION:  finger-nose-finger, fine finger movements normal  REFLEXES:  deep tendon reflexes TRACE and symmetric  GAIT/STATION:  narrow based gait; MILD UNSTEADY     DIAGNOSTIC DATA (LABS, IMAGING, TESTING) - I reviewed patient  records, labs, notes, testing and imaging myself where available.  Lab Results  Component Value Date   WBC 7.7 05/30/2020   HGB 11.9 (L) 05/30/2020   HCT 35.6 (L) 05/30/2020   MCV 98.6 05/30/2020   PLT 215 05/30/2020      Component Value Date/Time   NA 140 05/30/2020 1344   K 3.2 (L) 05/30/2020 1344   CL 103 05/30/2020 1344   CO2 25 05/30/2020 1344   GLUCOSE 132 (H) 05/30/2020 1344   BUN 15 05/30/2020 1344   CREATININE 1.29 (H) 05/30/2020 1344   CALCIUM 9.8 05/30/2020 1344   GFRNONAA 41 (L) 05/30/2020 1344   No results found for: CHOL, HDL, LDLCALC, LDLDIRECT, TRIG, CHOLHDL No results found for: HGBA1C Lab Results  Component Value Date   VITAMINB12 346 07/27/2014   Lab Results  Component Value Date   TSH 2.088 07/27/2014    04/22/19 MRI brain [I reviewed images myself and agree with interpretation. -VRP] - Motion degraded examination. - No evidence of acute intracranial abnormality. - Moderate chronic small vessel ischemic disease, slightly progressed from prior MRI 07/29/2014. - Stable, moderate cerebral atrophy without definite lobar predominance. - Right mastoid effusion.   ASSESSMENT AND PLAN  87 y.o. year old female here with moderate dementia.   Dx:  1. Moderate dementia with behavioral disturbance ([place])       PLAN:  MODERATE DEMENTIA - continue memantine to '10mg'$  twice a day - continue lexapro for mood stabilization - safety / supervision issues reviewed - caregiver resources provided - no driving; needs help with medications and finances; agree should not live alone  Return for return to PCP, pending if symptoms worsen or fail to improve.    Penni Bombard, MD [id number], [id number] AM Certified in Neurology, Neurophysiology and Neuroimaging  [facility] [address]  [place], [place] [address] [phone]

## 2020-11-11 DIAGNOSIS — I129 Hypertensive chronic kidney disease with stage 1 through stage 4 chronic kidney disease, or unspecified chronic kidney disease: Secondary | ICD-10-CM | POA: Diagnosis not present

## 2020-11-11 DIAGNOSIS — N183 Chronic kidney disease, stage 3 unspecified: Secondary | ICD-10-CM | POA: Diagnosis not present

## 2020-11-11 DIAGNOSIS — K219 Gastro-esophageal reflux disease without esophagitis: Secondary | ICD-10-CM | POA: Diagnosis not present

## 2020-11-11 DIAGNOSIS — E785 Hyperlipidemia, unspecified: Secondary | ICD-10-CM | POA: Diagnosis not present

## 2020-11-11 DIAGNOSIS — M810 Age-related osteoporosis without current pathological fracture: Secondary | ICD-10-CM | POA: Diagnosis not present

## 2020-11-11 DIAGNOSIS — J4520 Mild intermittent asthma, uncomplicated: Secondary | ICD-10-CM | POA: Diagnosis not present

## 2020-11-19 DIAGNOSIS — S32110A Nondisplaced Zone I fracture of sacrum, initial encounter for closed fracture: Secondary | ICD-10-CM | POA: Diagnosis not present

## 2020-11-19 DIAGNOSIS — S22060A Wedge compression fracture of T7-T8 vertebra, initial encounter for closed fracture: Secondary | ICD-10-CM | POA: Diagnosis not present

## 2020-11-19 DIAGNOSIS — R0902 Hypoxemia: Secondary | ICD-10-CM | POA: Diagnosis not present

## 2020-11-19 DIAGNOSIS — Z743 Need for continuous supervision: Secondary | ICD-10-CM | POA: Diagnosis not present

## 2020-11-19 DIAGNOSIS — S299XXA Unspecified injury of thorax, initial encounter: Secondary | ICD-10-CM | POA: Diagnosis not present

## 2020-11-19 DIAGNOSIS — Y92009 Unspecified place in unspecified non-institutional (private) residence as the place of occurrence of the external cause: Secondary | ICD-10-CM | POA: Diagnosis not present

## 2020-11-19 DIAGNOSIS — R339 Retention of urine, unspecified: Secondary | ICD-10-CM | POA: Diagnosis not present

## 2020-11-19 DIAGNOSIS — K5900 Constipation, unspecified: Secondary | ICD-10-CM | POA: Diagnosis not present

## 2020-11-19 DIAGNOSIS — M542 Cervicalgia: Secondary | ICD-10-CM | POA: Diagnosis not present

## 2020-11-19 DIAGNOSIS — F32A Depression, unspecified: Secondary | ICD-10-CM | POA: Diagnosis not present

## 2020-11-19 DIAGNOSIS — J9811 Atelectasis: Secondary | ICD-10-CM | POA: Diagnosis not present

## 2020-11-19 DIAGNOSIS — R451 Restlessness and agitation: Secondary | ICD-10-CM | POA: Diagnosis not present

## 2020-11-19 DIAGNOSIS — M4313 Spondylolisthesis, cervicothoracic region: Secondary | ICD-10-CM | POA: Diagnosis not present

## 2020-11-19 DIAGNOSIS — K5903 Drug induced constipation: Secondary | ICD-10-CM | POA: Diagnosis not present

## 2020-11-19 DIAGNOSIS — S3210XA Unspecified fracture of sacrum, initial encounter for closed fracture: Secondary | ICD-10-CM | POA: Diagnosis not present

## 2020-11-19 DIAGNOSIS — S32591A Other specified fracture of right pubis, initial encounter for closed fracture: Secondary | ICD-10-CM | POA: Diagnosis not present

## 2020-11-19 DIAGNOSIS — J4530 Mild persistent asthma, uncomplicated: Secondary | ICD-10-CM | POA: Diagnosis not present

## 2020-11-19 DIAGNOSIS — E861 Hypovolemia: Secondary | ICD-10-CM | POA: Diagnosis not present

## 2020-11-19 DIAGNOSIS — E869 Volume depletion, unspecified: Secondary | ICD-10-CM | POA: Diagnosis not present

## 2020-11-19 DIAGNOSIS — W1839XA Other fall on same level, initial encounter: Secondary | ICD-10-CM | POA: Diagnosis not present

## 2020-11-19 DIAGNOSIS — M24151 Other articular cartilage disorders, right hip: Secondary | ICD-10-CM | POA: Diagnosis not present

## 2020-11-19 DIAGNOSIS — S199XXA Unspecified injury of neck, initial encounter: Secondary | ICD-10-CM | POA: Diagnosis not present

## 2020-11-19 DIAGNOSIS — M25572 Pain in left ankle and joints of left foot: Secondary | ICD-10-CM | POA: Diagnosis not present

## 2020-11-19 DIAGNOSIS — Z79899 Other long term (current) drug therapy: Secondary | ICD-10-CM | POA: Diagnosis not present

## 2020-11-19 DIAGNOSIS — S32431A Displaced fracture of anterior column [iliopubic] of right acetabulum, initial encounter for closed fracture: Secondary | ICD-10-CM | POA: Diagnosis not present

## 2020-11-19 DIAGNOSIS — G8911 Acute pain due to trauma: Secondary | ICD-10-CM | POA: Diagnosis not present

## 2020-11-19 DIAGNOSIS — G319 Degenerative disease of nervous system, unspecified: Secondary | ICD-10-CM | POA: Diagnosis not present

## 2020-11-19 DIAGNOSIS — W19XXXA Unspecified fall, initial encounter: Secondary | ICD-10-CM | POA: Diagnosis not present

## 2020-11-19 DIAGNOSIS — I10 Essential (primary) hypertension: Secondary | ICD-10-CM | POA: Diagnosis not present

## 2020-11-19 DIAGNOSIS — S0990XA Unspecified injury of head, initial encounter: Secondary | ICD-10-CM | POA: Diagnosis not present

## 2020-11-19 DIAGNOSIS — G8929 Other chronic pain: Secondary | ICD-10-CM | POA: Diagnosis not present

## 2020-11-19 DIAGNOSIS — Y999 Unspecified external cause status: Secondary | ICD-10-CM | POA: Diagnosis not present

## 2020-11-19 DIAGNOSIS — Y939 Activity, unspecified: Secondary | ICD-10-CM | POA: Diagnosis not present

## 2020-11-19 DIAGNOSIS — S72001A Fracture of unspecified part of neck of right femur, initial encounter for closed fracture: Secondary | ICD-10-CM | POA: Diagnosis not present

## 2020-11-19 DIAGNOSIS — S32491A Other specified fracture of right acetabulum, initial encounter for closed fracture: Secondary | ICD-10-CM | POA: Diagnosis not present

## 2020-11-19 DIAGNOSIS — E785 Hyperlipidemia, unspecified: Secondary | ICD-10-CM | POA: Diagnosis not present

## 2020-11-19 DIAGNOSIS — S32401A Unspecified fracture of right acetabulum, initial encounter for closed fracture: Secondary | ICD-10-CM | POA: Diagnosis not present

## 2020-11-19 DIAGNOSIS — R6889 Other general symptoms and signs: Secondary | ICD-10-CM | POA: Diagnosis not present

## 2020-11-19 DIAGNOSIS — M25551 Pain in right hip: Secondary | ICD-10-CM | POA: Diagnosis not present

## 2020-11-19 DIAGNOSIS — T402X5A Adverse effect of other opioids, initial encounter: Secondary | ICD-10-CM | POA: Diagnosis not present

## 2020-11-19 DIAGNOSIS — S22040A Wedge compression fracture of fourth thoracic vertebra, initial encounter for closed fracture: Secondary | ICD-10-CM | POA: Diagnosis not present

## 2020-11-19 DIAGNOSIS — S32511A Fracture of superior rim of right pubis, initial encounter for closed fracture: Secondary | ICD-10-CM | POA: Diagnosis not present

## 2020-11-19 DIAGNOSIS — R1310 Dysphagia, unspecified: Secondary | ICD-10-CM | POA: Diagnosis not present

## 2020-11-19 DIAGNOSIS — I2510 Atherosclerotic heart disease of native coronary artery without angina pectoris: Secondary | ICD-10-CM | POA: Diagnosis not present

## 2020-11-19 DIAGNOSIS — S32434A Nondisplaced fracture of anterior column [iliopubic] of right acetabulum, initial encounter for closed fracture: Secondary | ICD-10-CM | POA: Diagnosis not present

## 2020-11-19 DIAGNOSIS — W010XXA Fall on same level from slipping, tripping and stumbling without subsequent striking against object, initial encounter: Secondary | ICD-10-CM | POA: Diagnosis not present

## 2020-11-19 DIAGNOSIS — Z9181 History of falling: Secondary | ICD-10-CM | POA: Diagnosis not present

## 2020-11-19 DIAGNOSIS — M4854XA Collapsed vertebra, not elsewhere classified, thoracic region, initial encounter for fracture: Secondary | ICD-10-CM | POA: Diagnosis not present

## 2020-11-19 DIAGNOSIS — S22080A Wedge compression fracture of T11-T12 vertebra, initial encounter for closed fracture: Secondary | ICD-10-CM | POA: Diagnosis not present

## 2020-11-19 DIAGNOSIS — M47816 Spondylosis without myelopathy or radiculopathy, lumbar region: Secondary | ICD-10-CM | POA: Diagnosis not present

## 2020-11-19 DIAGNOSIS — M810 Age-related osteoporosis without current pathological fracture: Secondary | ICD-10-CM | POA: Diagnosis not present

## 2020-11-19 DIAGNOSIS — E538 Deficiency of other specified B group vitamins: Secondary | ICD-10-CM | POA: Diagnosis not present

## 2020-11-19 DIAGNOSIS — E876 Hypokalemia: Secondary | ICD-10-CM | POA: Diagnosis not present

## 2020-11-19 DIAGNOSIS — N179 Acute kidney failure, unspecified: Secondary | ICD-10-CM | POA: Diagnosis not present

## 2020-11-20 DIAGNOSIS — M25551 Pain in right hip: Secondary | ICD-10-CM | POA: Diagnosis not present

## 2020-11-20 DIAGNOSIS — S32401A Unspecified fracture of right acetabulum, initial encounter for closed fracture: Secondary | ICD-10-CM | POA: Diagnosis not present

## 2020-11-20 DIAGNOSIS — I10 Essential (primary) hypertension: Secondary | ICD-10-CM | POA: Diagnosis not present

## 2020-11-20 DIAGNOSIS — M24151 Other articular cartilage disorders, right hip: Secondary | ICD-10-CM | POA: Diagnosis not present

## 2020-11-20 DIAGNOSIS — S32511A Fracture of superior rim of right pubis, initial encounter for closed fracture: Secondary | ICD-10-CM | POA: Diagnosis not present

## 2020-11-20 DIAGNOSIS — E538 Deficiency of other specified B group vitamins: Secondary | ICD-10-CM | POA: Diagnosis not present

## 2020-11-20 DIAGNOSIS — S32591A Other specified fracture of right pubis, initial encounter for closed fracture: Secondary | ICD-10-CM | POA: Diagnosis not present

## 2020-11-20 DIAGNOSIS — W010XXA Fall on same level from slipping, tripping and stumbling without subsequent striking against object, initial encounter: Secondary | ICD-10-CM | POA: Diagnosis not present

## 2020-11-20 DIAGNOSIS — Z9181 History of falling: Secondary | ICD-10-CM | POA: Diagnosis not present

## 2020-11-20 DIAGNOSIS — E785 Hyperlipidemia, unspecified: Secondary | ICD-10-CM | POA: Diagnosis not present

## 2020-11-20 DIAGNOSIS — W19XXXA Unspecified fall, initial encounter: Secondary | ICD-10-CM | POA: Diagnosis not present

## 2020-11-20 DIAGNOSIS — G8911 Acute pain due to trauma: Secondary | ICD-10-CM | POA: Diagnosis not present

## 2020-11-20 DIAGNOSIS — S72001A Fracture of unspecified part of neck of right femur, initial encounter for closed fracture: Secondary | ICD-10-CM | POA: Diagnosis not present

## 2020-11-20 DIAGNOSIS — E876 Hypokalemia: Secondary | ICD-10-CM | POA: Diagnosis not present

## 2020-11-20 DIAGNOSIS — Y939 Activity, unspecified: Secondary | ICD-10-CM | POA: Diagnosis not present

## 2020-11-20 DIAGNOSIS — Y92009 Unspecified place in unspecified non-institutional (private) residence as the place of occurrence of the external cause: Secondary | ICD-10-CM | POA: Diagnosis not present

## 2020-11-21 DIAGNOSIS — S32591A Other specified fracture of right pubis, initial encounter for closed fracture: Secondary | ICD-10-CM | POA: Diagnosis not present

## 2020-11-21 DIAGNOSIS — S32491A Other specified fracture of right acetabulum, initial encounter for closed fracture: Secondary | ICD-10-CM | POA: Diagnosis not present

## 2020-11-21 DIAGNOSIS — E876 Hypokalemia: Secondary | ICD-10-CM | POA: Diagnosis not present

## 2020-11-21 DIAGNOSIS — W010XXA Fall on same level from slipping, tripping and stumbling without subsequent striking against object, initial encounter: Secondary | ICD-10-CM | POA: Diagnosis not present

## 2020-11-21 DIAGNOSIS — Y939 Activity, unspecified: Secondary | ICD-10-CM | POA: Diagnosis not present

## 2020-11-21 DIAGNOSIS — E861 Hypovolemia: Secondary | ICD-10-CM | POA: Diagnosis not present

## 2020-11-21 DIAGNOSIS — I10 Essential (primary) hypertension: Secondary | ICD-10-CM | POA: Diagnosis not present

## 2020-11-21 DIAGNOSIS — E785 Hyperlipidemia, unspecified: Secondary | ICD-10-CM | POA: Diagnosis not present

## 2020-11-21 DIAGNOSIS — N179 Acute kidney failure, unspecified: Secondary | ICD-10-CM | POA: Diagnosis not present

## 2020-11-21 DIAGNOSIS — Y92009 Unspecified place in unspecified non-institutional (private) residence as the place of occurrence of the external cause: Secondary | ICD-10-CM | POA: Diagnosis not present

## 2020-11-22 DIAGNOSIS — W010XXA Fall on same level from slipping, tripping and stumbling without subsequent striking against object, initial encounter: Secondary | ICD-10-CM | POA: Diagnosis not present

## 2020-11-22 DIAGNOSIS — N179 Acute kidney failure, unspecified: Secondary | ICD-10-CM | POA: Diagnosis not present

## 2020-11-22 DIAGNOSIS — K5900 Constipation, unspecified: Secondary | ICD-10-CM | POA: Diagnosis not present

## 2020-11-22 DIAGNOSIS — E785 Hyperlipidemia, unspecified: Secondary | ICD-10-CM | POA: Diagnosis not present

## 2020-11-22 DIAGNOSIS — E861 Hypovolemia: Secondary | ICD-10-CM | POA: Diagnosis not present

## 2020-11-22 DIAGNOSIS — E876 Hypokalemia: Secondary | ICD-10-CM | POA: Diagnosis not present

## 2020-11-22 DIAGNOSIS — S32591A Other specified fracture of right pubis, initial encounter for closed fracture: Secondary | ICD-10-CM | POA: Diagnosis not present

## 2020-11-22 DIAGNOSIS — R339 Retention of urine, unspecified: Secondary | ICD-10-CM | POA: Diagnosis not present

## 2020-11-22 DIAGNOSIS — S32491A Other specified fracture of right acetabulum, initial encounter for closed fracture: Secondary | ICD-10-CM | POA: Diagnosis not present

## 2020-11-22 DIAGNOSIS — Y92009 Unspecified place in unspecified non-institutional (private) residence as the place of occurrence of the external cause: Secondary | ICD-10-CM | POA: Diagnosis not present

## 2020-11-22 DIAGNOSIS — I10 Essential (primary) hypertension: Secondary | ICD-10-CM | POA: Diagnosis not present

## 2020-11-23 DIAGNOSIS — K5900 Constipation, unspecified: Secondary | ICD-10-CM | POA: Diagnosis not present

## 2020-11-23 DIAGNOSIS — F32A Depression, unspecified: Secondary | ICD-10-CM | POA: Diagnosis not present

## 2020-11-23 DIAGNOSIS — N179 Acute kidney failure, unspecified: Secondary | ICD-10-CM | POA: Diagnosis not present

## 2020-11-23 DIAGNOSIS — G8929 Other chronic pain: Secondary | ICD-10-CM | POA: Diagnosis not present

## 2020-11-23 DIAGNOSIS — E538 Deficiency of other specified B group vitamins: Secondary | ICD-10-CM | POA: Diagnosis not present

## 2020-11-23 DIAGNOSIS — R451 Restlessness and agitation: Secondary | ICD-10-CM | POA: Diagnosis not present

## 2020-11-23 DIAGNOSIS — R339 Retention of urine, unspecified: Secondary | ICD-10-CM | POA: Diagnosis not present

## 2020-11-23 DIAGNOSIS — S32591A Other specified fracture of right pubis, initial encounter for closed fracture: Secondary | ICD-10-CM | POA: Diagnosis not present

## 2020-11-23 DIAGNOSIS — E876 Hypokalemia: Secondary | ICD-10-CM | POA: Diagnosis not present

## 2020-11-23 DIAGNOSIS — I10 Essential (primary) hypertension: Secondary | ICD-10-CM | POA: Diagnosis not present

## 2020-11-23 DIAGNOSIS — S32434A Nondisplaced fracture of anterior column [iliopubic] of right acetabulum, initial encounter for closed fracture: Secondary | ICD-10-CM | POA: Diagnosis not present

## 2020-11-23 DIAGNOSIS — J4530 Mild persistent asthma, uncomplicated: Secondary | ICD-10-CM | POA: Diagnosis not present

## 2020-11-24 DIAGNOSIS — E876 Hypokalemia: Secondary | ICD-10-CM | POA: Diagnosis not present

## 2020-11-24 DIAGNOSIS — R339 Retention of urine, unspecified: Secondary | ICD-10-CM | POA: Diagnosis not present

## 2020-11-24 DIAGNOSIS — J4530 Mild persistent asthma, uncomplicated: Secondary | ICD-10-CM | POA: Diagnosis not present

## 2020-11-24 DIAGNOSIS — I10 Essential (primary) hypertension: Secondary | ICD-10-CM | POA: Diagnosis not present

## 2020-11-24 DIAGNOSIS — K5900 Constipation, unspecified: Secondary | ICD-10-CM | POA: Diagnosis not present

## 2020-11-24 DIAGNOSIS — N179 Acute kidney failure, unspecified: Secondary | ICD-10-CM | POA: Diagnosis not present

## 2020-11-24 DIAGNOSIS — F32A Depression, unspecified: Secondary | ICD-10-CM | POA: Diagnosis not present

## 2020-11-24 DIAGNOSIS — G8929 Other chronic pain: Secondary | ICD-10-CM | POA: Diagnosis not present

## 2020-11-24 DIAGNOSIS — E538 Deficiency of other specified B group vitamins: Secondary | ICD-10-CM | POA: Diagnosis not present

## 2020-11-24 DIAGNOSIS — S32591A Other specified fracture of right pubis, initial encounter for closed fracture: Secondary | ICD-10-CM | POA: Diagnosis not present

## 2020-11-27 DIAGNOSIS — I10 Essential (primary) hypertension: Secondary | ICD-10-CM | POA: Diagnosis not present

## 2020-11-27 DIAGNOSIS — Z789 Other specified health status: Secondary | ICD-10-CM | POA: Diagnosis not present

## 2020-11-27 DIAGNOSIS — R1312 Dysphagia, oropharyngeal phase: Secondary | ICD-10-CM | POA: Diagnosis not present

## 2020-11-27 DIAGNOSIS — S32401A Unspecified fracture of right acetabulum, initial encounter for closed fracture: Secondary | ICD-10-CM | POA: Diagnosis not present

## 2020-11-27 DIAGNOSIS — W19XXXA Unspecified fall, initial encounter: Secondary | ICD-10-CM | POA: Diagnosis not present

## 2020-11-27 DIAGNOSIS — S22080D Wedge compression fracture of T11-T12 vertebra, subsequent encounter for fracture with routine healing: Secondary | ICD-10-CM | POA: Diagnosis not present

## 2020-11-27 DIAGNOSIS — S32591A Other specified fracture of right pubis, initial encounter for closed fracture: Secondary | ICD-10-CM | POA: Diagnosis not present

## 2020-11-27 DIAGNOSIS — S22060D Wedge compression fracture of T7-T8 vertebra, subsequent encounter for fracture with routine healing: Secondary | ICD-10-CM | POA: Diagnosis not present

## 2020-11-30 DIAGNOSIS — S32401A Unspecified fracture of right acetabulum, initial encounter for closed fracture: Secondary | ICD-10-CM | POA: Diagnosis not present

## 2020-11-30 DIAGNOSIS — S22080D Wedge compression fracture of T11-T12 vertebra, subsequent encounter for fracture with routine healing: Secondary | ICD-10-CM | POA: Diagnosis not present

## 2020-11-30 DIAGNOSIS — W19XXXA Unspecified fall, initial encounter: Secondary | ICD-10-CM | POA: Diagnosis not present

## 2020-11-30 DIAGNOSIS — R1312 Dysphagia, oropharyngeal phase: Secondary | ICD-10-CM | POA: Diagnosis not present

## 2020-11-30 DIAGNOSIS — S32591A Other specified fracture of right pubis, initial encounter for closed fracture: Secondary | ICD-10-CM | POA: Diagnosis not present

## 2020-11-30 DIAGNOSIS — I10 Essential (primary) hypertension: Secondary | ICD-10-CM | POA: Diagnosis not present

## 2020-11-30 DIAGNOSIS — S22060D Wedge compression fracture of T7-T8 vertebra, subsequent encounter for fracture with routine healing: Secondary | ICD-10-CM | POA: Diagnosis not present

## 2020-11-30 DIAGNOSIS — Z789 Other specified health status: Secondary | ICD-10-CM | POA: Diagnosis not present

## 2020-12-01 DIAGNOSIS — I10 Essential (primary) hypertension: Secondary | ICD-10-CM | POA: Diagnosis not present

## 2020-12-01 DIAGNOSIS — Z789 Other specified health status: Secondary | ICD-10-CM | POA: Diagnosis not present

## 2020-12-01 DIAGNOSIS — S32591A Other specified fracture of right pubis, initial encounter for closed fracture: Secondary | ICD-10-CM | POA: Diagnosis not present

## 2020-12-01 DIAGNOSIS — R1312 Dysphagia, oropharyngeal phase: Secondary | ICD-10-CM | POA: Diagnosis not present

## 2020-12-01 DIAGNOSIS — W19XXXA Unspecified fall, initial encounter: Secondary | ICD-10-CM | POA: Diagnosis not present

## 2020-12-01 DIAGNOSIS — S22060D Wedge compression fracture of T7-T8 vertebra, subsequent encounter for fracture with routine healing: Secondary | ICD-10-CM | POA: Diagnosis not present

## 2020-12-01 DIAGNOSIS — S22080D Wedge compression fracture of T11-T12 vertebra, subsequent encounter for fracture with routine healing: Secondary | ICD-10-CM | POA: Diagnosis not present

## 2020-12-01 DIAGNOSIS — S32401A Unspecified fracture of right acetabulum, initial encounter for closed fracture: Secondary | ICD-10-CM | POA: Diagnosis not present

## 2020-12-03 DIAGNOSIS — I10 Essential (primary) hypertension: Secondary | ICD-10-CM | POA: Diagnosis not present

## 2020-12-03 DIAGNOSIS — S32591A Other specified fracture of right pubis, initial encounter for closed fracture: Secondary | ICD-10-CM | POA: Diagnosis not present

## 2020-12-03 DIAGNOSIS — S22080D Wedge compression fracture of T11-T12 vertebra, subsequent encounter for fracture with routine healing: Secondary | ICD-10-CM | POA: Diagnosis not present

## 2020-12-03 DIAGNOSIS — S22060D Wedge compression fracture of T7-T8 vertebra, subsequent encounter for fracture with routine healing: Secondary | ICD-10-CM | POA: Diagnosis not present

## 2020-12-03 DIAGNOSIS — R1312 Dysphagia, oropharyngeal phase: Secondary | ICD-10-CM | POA: Diagnosis not present

## 2020-12-03 DIAGNOSIS — S32401A Unspecified fracture of right acetabulum, initial encounter for closed fracture: Secondary | ICD-10-CM | POA: Diagnosis not present

## 2020-12-03 DIAGNOSIS — W19XXXA Unspecified fall, initial encounter: Secondary | ICD-10-CM | POA: Diagnosis not present

## 2020-12-03 DIAGNOSIS — Z789 Other specified health status: Secondary | ICD-10-CM | POA: Diagnosis not present

## 2020-12-06 DIAGNOSIS — R1312 Dysphagia, oropharyngeal phase: Secondary | ICD-10-CM | POA: Diagnosis not present

## 2020-12-06 DIAGNOSIS — S22060D Wedge compression fracture of T7-T8 vertebra, subsequent encounter for fracture with routine healing: Secondary | ICD-10-CM | POA: Diagnosis not present

## 2020-12-06 DIAGNOSIS — S32591A Other specified fracture of right pubis, initial encounter for closed fracture: Secondary | ICD-10-CM | POA: Diagnosis not present

## 2020-12-06 DIAGNOSIS — Z789 Other specified health status: Secondary | ICD-10-CM | POA: Diagnosis not present

## 2020-12-06 DIAGNOSIS — I10 Essential (primary) hypertension: Secondary | ICD-10-CM | POA: Diagnosis not present

## 2020-12-06 DIAGNOSIS — S22080D Wedge compression fracture of T11-T12 vertebra, subsequent encounter for fracture with routine healing: Secondary | ICD-10-CM | POA: Diagnosis not present

## 2020-12-06 DIAGNOSIS — W19XXXA Unspecified fall, initial encounter: Secondary | ICD-10-CM | POA: Diagnosis not present

## 2020-12-06 DIAGNOSIS — S32401A Unspecified fracture of right acetabulum, initial encounter for closed fracture: Secondary | ICD-10-CM | POA: Diagnosis not present

## 2020-12-07 DIAGNOSIS — R1312 Dysphagia, oropharyngeal phase: Secondary | ICD-10-CM | POA: Diagnosis not present

## 2020-12-07 DIAGNOSIS — S32591A Other specified fracture of right pubis, initial encounter for closed fracture: Secondary | ICD-10-CM | POA: Diagnosis not present

## 2020-12-07 DIAGNOSIS — W19XXXA Unspecified fall, initial encounter: Secondary | ICD-10-CM | POA: Diagnosis not present

## 2020-12-07 DIAGNOSIS — S22060D Wedge compression fracture of T7-T8 vertebra, subsequent encounter for fracture with routine healing: Secondary | ICD-10-CM | POA: Diagnosis not present

## 2020-12-07 DIAGNOSIS — S22080D Wedge compression fracture of T11-T12 vertebra, subsequent encounter for fracture with routine healing: Secondary | ICD-10-CM | POA: Diagnosis not present

## 2020-12-07 DIAGNOSIS — Z789 Other specified health status: Secondary | ICD-10-CM | POA: Diagnosis not present

## 2020-12-07 DIAGNOSIS — I10 Essential (primary) hypertension: Secondary | ICD-10-CM | POA: Diagnosis not present

## 2020-12-07 DIAGNOSIS — S32401A Unspecified fracture of right acetabulum, initial encounter for closed fracture: Secondary | ICD-10-CM | POA: Diagnosis not present

## 2020-12-08 DIAGNOSIS — S22080D Wedge compression fracture of T11-T12 vertebra, subsequent encounter for fracture with routine healing: Secondary | ICD-10-CM | POA: Diagnosis not present

## 2020-12-08 DIAGNOSIS — Z789 Other specified health status: Secondary | ICD-10-CM | POA: Diagnosis not present

## 2020-12-08 DIAGNOSIS — I10 Essential (primary) hypertension: Secondary | ICD-10-CM | POA: Diagnosis not present

## 2020-12-08 DIAGNOSIS — R1312 Dysphagia, oropharyngeal phase: Secondary | ICD-10-CM | POA: Diagnosis not present

## 2020-12-08 DIAGNOSIS — S22060D Wedge compression fracture of T7-T8 vertebra, subsequent encounter for fracture with routine healing: Secondary | ICD-10-CM | POA: Diagnosis not present

## 2020-12-08 DIAGNOSIS — S32591A Other specified fracture of right pubis, initial encounter for closed fracture: Secondary | ICD-10-CM | POA: Diagnosis not present

## 2020-12-08 DIAGNOSIS — W19XXXA Unspecified fall, initial encounter: Secondary | ICD-10-CM | POA: Diagnosis not present

## 2020-12-08 DIAGNOSIS — S32401A Unspecified fracture of right acetabulum, initial encounter for closed fracture: Secondary | ICD-10-CM | POA: Diagnosis not present

## 2020-12-09 DIAGNOSIS — R300 Dysuria: Secondary | ICD-10-CM | POA: Diagnosis not present

## 2020-12-09 DIAGNOSIS — S22080D Wedge compression fracture of T11-T12 vertebra, subsequent encounter for fracture with routine healing: Secondary | ICD-10-CM | POA: Diagnosis not present

## 2020-12-09 DIAGNOSIS — S32401A Unspecified fracture of right acetabulum, initial encounter for closed fracture: Secondary | ICD-10-CM | POA: Diagnosis not present

## 2020-12-09 DIAGNOSIS — S329XXA Fracture of unspecified parts of lumbosacral spine and pelvis, initial encounter for closed fracture: Secondary | ICD-10-CM | POA: Diagnosis not present

## 2020-12-09 DIAGNOSIS — W19XXXA Unspecified fall, initial encounter: Secondary | ICD-10-CM | POA: Diagnosis not present

## 2020-12-09 DIAGNOSIS — Z789 Other specified health status: Secondary | ICD-10-CM | POA: Diagnosis not present

## 2020-12-09 DIAGNOSIS — R1312 Dysphagia, oropharyngeal phase: Secondary | ICD-10-CM | POA: Diagnosis not present

## 2020-12-09 DIAGNOSIS — N183 Chronic kidney disease, stage 3 unspecified: Secondary | ICD-10-CM | POA: Diagnosis not present

## 2020-12-09 DIAGNOSIS — I129 Hypertensive chronic kidney disease with stage 1 through stage 4 chronic kidney disease, or unspecified chronic kidney disease: Secondary | ICD-10-CM | POA: Diagnosis not present

## 2020-12-09 DIAGNOSIS — S22060D Wedge compression fracture of T7-T8 vertebra, subsequent encounter for fracture with routine healing: Secondary | ICD-10-CM | POA: Diagnosis not present

## 2020-12-09 DIAGNOSIS — S32591A Other specified fracture of right pubis, initial encounter for closed fracture: Secondary | ICD-10-CM | POA: Diagnosis not present

## 2020-12-09 DIAGNOSIS — I10 Essential (primary) hypertension: Secondary | ICD-10-CM | POA: Diagnosis not present

## 2020-12-10 DIAGNOSIS — S22080D Wedge compression fracture of T11-T12 vertebra, subsequent encounter for fracture with routine healing: Secondary | ICD-10-CM | POA: Diagnosis not present

## 2020-12-10 DIAGNOSIS — I10 Essential (primary) hypertension: Secondary | ICD-10-CM | POA: Diagnosis not present

## 2020-12-10 DIAGNOSIS — Z789 Other specified health status: Secondary | ICD-10-CM | POA: Diagnosis not present

## 2020-12-10 DIAGNOSIS — S32401A Unspecified fracture of right acetabulum, initial encounter for closed fracture: Secondary | ICD-10-CM | POA: Diagnosis not present

## 2020-12-10 DIAGNOSIS — R1312 Dysphagia, oropharyngeal phase: Secondary | ICD-10-CM | POA: Diagnosis not present

## 2020-12-10 DIAGNOSIS — S22060D Wedge compression fracture of T7-T8 vertebra, subsequent encounter for fracture with routine healing: Secondary | ICD-10-CM | POA: Diagnosis not present

## 2020-12-10 DIAGNOSIS — S32591A Other specified fracture of right pubis, initial encounter for closed fracture: Secondary | ICD-10-CM | POA: Diagnosis not present

## 2020-12-10 DIAGNOSIS — W19XXXA Unspecified fall, initial encounter: Secondary | ICD-10-CM | POA: Diagnosis not present

## 2020-12-13 DIAGNOSIS — I10 Essential (primary) hypertension: Secondary | ICD-10-CM | POA: Diagnosis not present

## 2020-12-13 DIAGNOSIS — R1312 Dysphagia, oropharyngeal phase: Secondary | ICD-10-CM | POA: Diagnosis not present

## 2020-12-13 DIAGNOSIS — S32591A Other specified fracture of right pubis, initial encounter for closed fracture: Secondary | ICD-10-CM | POA: Diagnosis not present

## 2020-12-13 DIAGNOSIS — Z789 Other specified health status: Secondary | ICD-10-CM | POA: Diagnosis not present

## 2020-12-13 DIAGNOSIS — S22060D Wedge compression fracture of T7-T8 vertebra, subsequent encounter for fracture with routine healing: Secondary | ICD-10-CM | POA: Diagnosis not present

## 2020-12-13 DIAGNOSIS — W19XXXA Unspecified fall, initial encounter: Secondary | ICD-10-CM | POA: Diagnosis not present

## 2020-12-13 DIAGNOSIS — S32401A Unspecified fracture of right acetabulum, initial encounter for closed fracture: Secondary | ICD-10-CM | POA: Diagnosis not present

## 2020-12-13 DIAGNOSIS — S22080D Wedge compression fracture of T11-T12 vertebra, subsequent encounter for fracture with routine healing: Secondary | ICD-10-CM | POA: Diagnosis not present

## 2020-12-14 DIAGNOSIS — S22060D Wedge compression fracture of T7-T8 vertebra, subsequent encounter for fracture with routine healing: Secondary | ICD-10-CM | POA: Diagnosis not present

## 2020-12-14 DIAGNOSIS — R1312 Dysphagia, oropharyngeal phase: Secondary | ICD-10-CM | POA: Diagnosis not present

## 2020-12-14 DIAGNOSIS — W19XXXA Unspecified fall, initial encounter: Secondary | ICD-10-CM | POA: Diagnosis not present

## 2020-12-14 DIAGNOSIS — S22080D Wedge compression fracture of T11-T12 vertebra, subsequent encounter for fracture with routine healing: Secondary | ICD-10-CM | POA: Diagnosis not present

## 2020-12-14 DIAGNOSIS — Z789 Other specified health status: Secondary | ICD-10-CM | POA: Diagnosis not present

## 2020-12-14 DIAGNOSIS — I10 Essential (primary) hypertension: Secondary | ICD-10-CM | POA: Diagnosis not present

## 2020-12-14 DIAGNOSIS — S32401A Unspecified fracture of right acetabulum, initial encounter for closed fracture: Secondary | ICD-10-CM | POA: Diagnosis not present

## 2020-12-14 DIAGNOSIS — S32591A Other specified fracture of right pubis, initial encounter for closed fracture: Secondary | ICD-10-CM | POA: Diagnosis not present

## 2020-12-15 DIAGNOSIS — W19XXXA Unspecified fall, initial encounter: Secondary | ICD-10-CM | POA: Diagnosis not present

## 2020-12-15 DIAGNOSIS — R1312 Dysphagia, oropharyngeal phase: Secondary | ICD-10-CM | POA: Diagnosis not present

## 2020-12-15 DIAGNOSIS — I10 Essential (primary) hypertension: Secondary | ICD-10-CM | POA: Diagnosis not present

## 2020-12-15 DIAGNOSIS — S32401A Unspecified fracture of right acetabulum, initial encounter for closed fracture: Secondary | ICD-10-CM | POA: Diagnosis not present

## 2020-12-15 DIAGNOSIS — Z789 Other specified health status: Secondary | ICD-10-CM | POA: Diagnosis not present

## 2020-12-15 DIAGNOSIS — S32591A Other specified fracture of right pubis, initial encounter for closed fracture: Secondary | ICD-10-CM | POA: Diagnosis not present

## 2020-12-15 DIAGNOSIS — S22060D Wedge compression fracture of T7-T8 vertebra, subsequent encounter for fracture with routine healing: Secondary | ICD-10-CM | POA: Diagnosis not present

## 2020-12-15 DIAGNOSIS — S22080D Wedge compression fracture of T11-T12 vertebra, subsequent encounter for fracture with routine healing: Secondary | ICD-10-CM | POA: Diagnosis not present

## 2020-12-16 DIAGNOSIS — S32401A Unspecified fracture of right acetabulum, initial encounter for closed fracture: Secondary | ICD-10-CM | POA: Diagnosis not present

## 2020-12-16 DIAGNOSIS — R1312 Dysphagia, oropharyngeal phase: Secondary | ICD-10-CM | POA: Diagnosis not present

## 2020-12-16 DIAGNOSIS — I10 Essential (primary) hypertension: Secondary | ICD-10-CM | POA: Diagnosis not present

## 2020-12-16 DIAGNOSIS — S32591A Other specified fracture of right pubis, initial encounter for closed fracture: Secondary | ICD-10-CM | POA: Diagnosis not present

## 2020-12-16 DIAGNOSIS — W19XXXA Unspecified fall, initial encounter: Secondary | ICD-10-CM | POA: Diagnosis not present

## 2020-12-16 DIAGNOSIS — S22080D Wedge compression fracture of T11-T12 vertebra, subsequent encounter for fracture with routine healing: Secondary | ICD-10-CM | POA: Diagnosis not present

## 2020-12-16 DIAGNOSIS — S22060D Wedge compression fracture of T7-T8 vertebra, subsequent encounter for fracture with routine healing: Secondary | ICD-10-CM | POA: Diagnosis not present

## 2020-12-16 DIAGNOSIS — Z789 Other specified health status: Secondary | ICD-10-CM | POA: Diagnosis not present

## 2020-12-17 DIAGNOSIS — R1312 Dysphagia, oropharyngeal phase: Secondary | ICD-10-CM | POA: Diagnosis not present

## 2020-12-17 DIAGNOSIS — I10 Essential (primary) hypertension: Secondary | ICD-10-CM | POA: Diagnosis not present

## 2020-12-17 DIAGNOSIS — S22080D Wedge compression fracture of T11-T12 vertebra, subsequent encounter for fracture with routine healing: Secondary | ICD-10-CM | POA: Diagnosis not present

## 2020-12-17 DIAGNOSIS — S32401A Unspecified fracture of right acetabulum, initial encounter for closed fracture: Secondary | ICD-10-CM | POA: Diagnosis not present

## 2020-12-17 DIAGNOSIS — S32591A Other specified fracture of right pubis, initial encounter for closed fracture: Secondary | ICD-10-CM | POA: Diagnosis not present

## 2020-12-17 DIAGNOSIS — S22060D Wedge compression fracture of T7-T8 vertebra, subsequent encounter for fracture with routine healing: Secondary | ICD-10-CM | POA: Diagnosis not present

## 2020-12-17 DIAGNOSIS — W19XXXA Unspecified fall, initial encounter: Secondary | ICD-10-CM | POA: Diagnosis not present

## 2020-12-17 DIAGNOSIS — Z789 Other specified health status: Secondary | ICD-10-CM | POA: Diagnosis not present

## 2020-12-18 DIAGNOSIS — S22060D Wedge compression fracture of T7-T8 vertebra, subsequent encounter for fracture with routine healing: Secondary | ICD-10-CM | POA: Diagnosis not present

## 2020-12-18 DIAGNOSIS — R1312 Dysphagia, oropharyngeal phase: Secondary | ICD-10-CM | POA: Diagnosis not present

## 2020-12-18 DIAGNOSIS — S32401A Unspecified fracture of right acetabulum, initial encounter for closed fracture: Secondary | ICD-10-CM | POA: Diagnosis not present

## 2020-12-18 DIAGNOSIS — I10 Essential (primary) hypertension: Secondary | ICD-10-CM | POA: Diagnosis not present

## 2020-12-18 DIAGNOSIS — Z789 Other specified health status: Secondary | ICD-10-CM | POA: Diagnosis not present

## 2020-12-18 DIAGNOSIS — S32591A Other specified fracture of right pubis, initial encounter for closed fracture: Secondary | ICD-10-CM | POA: Diagnosis not present

## 2020-12-18 DIAGNOSIS — S22080D Wedge compression fracture of T11-T12 vertebra, subsequent encounter for fracture with routine healing: Secondary | ICD-10-CM | POA: Diagnosis not present

## 2020-12-18 DIAGNOSIS — W19XXXA Unspecified fall, initial encounter: Secondary | ICD-10-CM | POA: Diagnosis not present

## 2020-12-20 DIAGNOSIS — Z789 Other specified health status: Secondary | ICD-10-CM | POA: Diagnosis not present

## 2020-12-20 DIAGNOSIS — S32591A Other specified fracture of right pubis, initial encounter for closed fracture: Secondary | ICD-10-CM | POA: Diagnosis not present

## 2020-12-20 DIAGNOSIS — R1312 Dysphagia, oropharyngeal phase: Secondary | ICD-10-CM | POA: Diagnosis not present

## 2020-12-20 DIAGNOSIS — S22080D Wedge compression fracture of T11-T12 vertebra, subsequent encounter for fracture with routine healing: Secondary | ICD-10-CM | POA: Diagnosis not present

## 2020-12-20 DIAGNOSIS — S32401A Unspecified fracture of right acetabulum, initial encounter for closed fracture: Secondary | ICD-10-CM | POA: Diagnosis not present

## 2020-12-20 DIAGNOSIS — I10 Essential (primary) hypertension: Secondary | ICD-10-CM | POA: Diagnosis not present

## 2020-12-20 DIAGNOSIS — S22060D Wedge compression fracture of T7-T8 vertebra, subsequent encounter for fracture with routine healing: Secondary | ICD-10-CM | POA: Diagnosis not present

## 2020-12-20 DIAGNOSIS — W19XXXA Unspecified fall, initial encounter: Secondary | ICD-10-CM | POA: Diagnosis not present

## 2020-12-22 DIAGNOSIS — S22080D Wedge compression fracture of T11-T12 vertebra, subsequent encounter for fracture with routine healing: Secondary | ICD-10-CM | POA: Diagnosis not present

## 2020-12-22 DIAGNOSIS — W19XXXA Unspecified fall, initial encounter: Secondary | ICD-10-CM | POA: Diagnosis not present

## 2020-12-22 DIAGNOSIS — Z789 Other specified health status: Secondary | ICD-10-CM | POA: Diagnosis not present

## 2020-12-22 DIAGNOSIS — I10 Essential (primary) hypertension: Secondary | ICD-10-CM | POA: Diagnosis not present

## 2020-12-22 DIAGNOSIS — S32591A Other specified fracture of right pubis, initial encounter for closed fracture: Secondary | ICD-10-CM | POA: Diagnosis not present

## 2020-12-22 DIAGNOSIS — S22060D Wedge compression fracture of T7-T8 vertebra, subsequent encounter for fracture with routine healing: Secondary | ICD-10-CM | POA: Diagnosis not present

## 2020-12-22 DIAGNOSIS — R1312 Dysphagia, oropharyngeal phase: Secondary | ICD-10-CM | POA: Diagnosis not present

## 2020-12-22 DIAGNOSIS — S32401A Unspecified fracture of right acetabulum, initial encounter for closed fracture: Secondary | ICD-10-CM | POA: Diagnosis not present

## 2020-12-23 DIAGNOSIS — W19XXXA Unspecified fall, initial encounter: Secondary | ICD-10-CM | POA: Diagnosis not present

## 2020-12-23 DIAGNOSIS — R1312 Dysphagia, oropharyngeal phase: Secondary | ICD-10-CM | POA: Diagnosis not present

## 2020-12-23 DIAGNOSIS — I10 Essential (primary) hypertension: Secondary | ICD-10-CM | POA: Diagnosis not present

## 2020-12-23 DIAGNOSIS — Z789 Other specified health status: Secondary | ICD-10-CM | POA: Diagnosis not present

## 2020-12-23 DIAGNOSIS — S32401A Unspecified fracture of right acetabulum, initial encounter for closed fracture: Secondary | ICD-10-CM | POA: Diagnosis not present

## 2020-12-23 DIAGNOSIS — S22060D Wedge compression fracture of T7-T8 vertebra, subsequent encounter for fracture with routine healing: Secondary | ICD-10-CM | POA: Diagnosis not present

## 2020-12-23 DIAGNOSIS — S32591A Other specified fracture of right pubis, initial encounter for closed fracture: Secondary | ICD-10-CM | POA: Diagnosis not present

## 2020-12-23 DIAGNOSIS — S22080D Wedge compression fracture of T11-T12 vertebra, subsequent encounter for fracture with routine healing: Secondary | ICD-10-CM | POA: Diagnosis not present

## 2020-12-24 DIAGNOSIS — Z789 Other specified health status: Secondary | ICD-10-CM | POA: Diagnosis not present

## 2020-12-24 DIAGNOSIS — S22060D Wedge compression fracture of T7-T8 vertebra, subsequent encounter for fracture with routine healing: Secondary | ICD-10-CM | POA: Diagnosis not present

## 2020-12-24 DIAGNOSIS — S32591A Other specified fracture of right pubis, initial encounter for closed fracture: Secondary | ICD-10-CM | POA: Diagnosis not present

## 2020-12-24 DIAGNOSIS — S22080D Wedge compression fracture of T11-T12 vertebra, subsequent encounter for fracture with routine healing: Secondary | ICD-10-CM | POA: Diagnosis not present

## 2020-12-24 DIAGNOSIS — R1312 Dysphagia, oropharyngeal phase: Secondary | ICD-10-CM | POA: Diagnosis not present

## 2020-12-24 DIAGNOSIS — I10 Essential (primary) hypertension: Secondary | ICD-10-CM | POA: Diagnosis not present

## 2020-12-24 DIAGNOSIS — W19XXXA Unspecified fall, initial encounter: Secondary | ICD-10-CM | POA: Diagnosis not present

## 2020-12-24 DIAGNOSIS — S32401A Unspecified fracture of right acetabulum, initial encounter for closed fracture: Secondary | ICD-10-CM | POA: Diagnosis not present

## 2020-12-26 DIAGNOSIS — S32591A Other specified fracture of right pubis, initial encounter for closed fracture: Secondary | ICD-10-CM | POA: Diagnosis not present

## 2020-12-26 DIAGNOSIS — R1310 Dysphagia, unspecified: Secondary | ICD-10-CM | POA: Diagnosis not present

## 2020-12-27 DIAGNOSIS — S22080D Wedge compression fracture of T11-T12 vertebra, subsequent encounter for fracture with routine healing: Secondary | ICD-10-CM | POA: Diagnosis not present

## 2020-12-27 DIAGNOSIS — S32401A Unspecified fracture of right acetabulum, initial encounter for closed fracture: Secondary | ICD-10-CM | POA: Diagnosis not present

## 2020-12-27 DIAGNOSIS — I10 Essential (primary) hypertension: Secondary | ICD-10-CM | POA: Diagnosis not present

## 2020-12-27 DIAGNOSIS — R1312 Dysphagia, oropharyngeal phase: Secondary | ICD-10-CM | POA: Diagnosis not present

## 2020-12-27 DIAGNOSIS — S32591A Other specified fracture of right pubis, initial encounter for closed fracture: Secondary | ICD-10-CM | POA: Diagnosis not present

## 2020-12-27 DIAGNOSIS — S22060D Wedge compression fracture of T7-T8 vertebra, subsequent encounter for fracture with routine healing: Secondary | ICD-10-CM | POA: Diagnosis not present

## 2020-12-27 DIAGNOSIS — Z789 Other specified health status: Secondary | ICD-10-CM | POA: Diagnosis not present

## 2020-12-27 DIAGNOSIS — W19XXXA Unspecified fall, initial encounter: Secondary | ICD-10-CM | POA: Diagnosis not present

## 2020-12-28 DIAGNOSIS — Z789 Other specified health status: Secondary | ICD-10-CM | POA: Diagnosis not present

## 2020-12-28 DIAGNOSIS — S22060D Wedge compression fracture of T7-T8 vertebra, subsequent encounter for fracture with routine healing: Secondary | ICD-10-CM | POA: Diagnosis not present

## 2020-12-28 DIAGNOSIS — W19XXXA Unspecified fall, initial encounter: Secondary | ICD-10-CM | POA: Diagnosis not present

## 2020-12-28 DIAGNOSIS — S32591A Other specified fracture of right pubis, initial encounter for closed fracture: Secondary | ICD-10-CM | POA: Diagnosis not present

## 2020-12-28 DIAGNOSIS — S32401A Unspecified fracture of right acetabulum, initial encounter for closed fracture: Secondary | ICD-10-CM | POA: Diagnosis not present

## 2020-12-28 DIAGNOSIS — S22080D Wedge compression fracture of T11-T12 vertebra, subsequent encounter for fracture with routine healing: Secondary | ICD-10-CM | POA: Diagnosis not present

## 2020-12-28 DIAGNOSIS — R1312 Dysphagia, oropharyngeal phase: Secondary | ICD-10-CM | POA: Diagnosis not present

## 2020-12-28 DIAGNOSIS — I10 Essential (primary) hypertension: Secondary | ICD-10-CM | POA: Diagnosis not present

## 2020-12-29 DIAGNOSIS — S22060D Wedge compression fracture of T7-T8 vertebra, subsequent encounter for fracture with routine healing: Secondary | ICD-10-CM | POA: Diagnosis not present

## 2020-12-29 DIAGNOSIS — W19XXXA Unspecified fall, initial encounter: Secondary | ICD-10-CM | POA: Diagnosis not present

## 2020-12-29 DIAGNOSIS — Z789 Other specified health status: Secondary | ICD-10-CM | POA: Diagnosis not present

## 2020-12-29 DIAGNOSIS — I10 Essential (primary) hypertension: Secondary | ICD-10-CM | POA: Diagnosis not present

## 2020-12-29 DIAGNOSIS — S32591A Other specified fracture of right pubis, initial encounter for closed fracture: Secondary | ICD-10-CM | POA: Diagnosis not present

## 2020-12-29 DIAGNOSIS — S32401A Unspecified fracture of right acetabulum, initial encounter for closed fracture: Secondary | ICD-10-CM | POA: Diagnosis not present

## 2020-12-29 DIAGNOSIS — S22080D Wedge compression fracture of T11-T12 vertebra, subsequent encounter for fracture with routine healing: Secondary | ICD-10-CM | POA: Diagnosis not present

## 2020-12-29 DIAGNOSIS — R1312 Dysphagia, oropharyngeal phase: Secondary | ICD-10-CM | POA: Diagnosis not present

## 2020-12-30 DIAGNOSIS — S32401A Unspecified fracture of right acetabulum, initial encounter for closed fracture: Secondary | ICD-10-CM | POA: Diagnosis not present

## 2020-12-30 DIAGNOSIS — Z789 Other specified health status: Secondary | ICD-10-CM | POA: Diagnosis not present

## 2020-12-30 DIAGNOSIS — S32591A Other specified fracture of right pubis, initial encounter for closed fracture: Secondary | ICD-10-CM | POA: Diagnosis not present

## 2020-12-30 DIAGNOSIS — S22060D Wedge compression fracture of T7-T8 vertebra, subsequent encounter for fracture with routine healing: Secondary | ICD-10-CM | POA: Diagnosis not present

## 2020-12-30 DIAGNOSIS — S22080D Wedge compression fracture of T11-T12 vertebra, subsequent encounter for fracture with routine healing: Secondary | ICD-10-CM | POA: Diagnosis not present

## 2020-12-30 DIAGNOSIS — W19XXXA Unspecified fall, initial encounter: Secondary | ICD-10-CM | POA: Diagnosis not present

## 2020-12-30 DIAGNOSIS — I10 Essential (primary) hypertension: Secondary | ICD-10-CM | POA: Diagnosis not present

## 2020-12-30 DIAGNOSIS — R1312 Dysphagia, oropharyngeal phase: Secondary | ICD-10-CM | POA: Diagnosis not present

## 2021-01-03 DIAGNOSIS — M545 Low back pain, unspecified: Secondary | ICD-10-CM | POA: Diagnosis not present

## 2021-01-03 DIAGNOSIS — M25551 Pain in right hip: Secondary | ICD-10-CM | POA: Diagnosis not present

## 2021-01-03 DIAGNOSIS — M546 Pain in thoracic spine: Secondary | ICD-10-CM | POA: Diagnosis not present

## 2021-01-04 DIAGNOSIS — S22080D Wedge compression fracture of T11-T12 vertebra, subsequent encounter for fracture with routine healing: Secondary | ICD-10-CM | POA: Diagnosis not present

## 2021-01-04 DIAGNOSIS — R1312 Dysphagia, oropharyngeal phase: Secondary | ICD-10-CM | POA: Diagnosis not present

## 2021-01-04 DIAGNOSIS — S22060D Wedge compression fracture of T7-T8 vertebra, subsequent encounter for fracture with routine healing: Secondary | ICD-10-CM | POA: Diagnosis not present

## 2021-01-04 DIAGNOSIS — W19XXXA Unspecified fall, initial encounter: Secondary | ICD-10-CM | POA: Diagnosis not present

## 2021-01-04 DIAGNOSIS — S32591A Other specified fracture of right pubis, initial encounter for closed fracture: Secondary | ICD-10-CM | POA: Diagnosis not present

## 2021-01-04 DIAGNOSIS — S32401A Unspecified fracture of right acetabulum, initial encounter for closed fracture: Secondary | ICD-10-CM | POA: Diagnosis not present

## 2021-01-04 DIAGNOSIS — Z789 Other specified health status: Secondary | ICD-10-CM | POA: Diagnosis not present

## 2021-01-04 DIAGNOSIS — I10 Essential (primary) hypertension: Secondary | ICD-10-CM | POA: Diagnosis not present

## 2021-01-05 DIAGNOSIS — M810 Age-related osteoporosis without current pathological fracture: Secondary | ICD-10-CM | POA: Diagnosis not present

## 2021-01-05 DIAGNOSIS — S3282XD Multiple fractures of pelvis without disruption of pelvic ring, subsequent encounter for fracture with routine healing: Secondary | ICD-10-CM | POA: Diagnosis not present

## 2021-01-05 DIAGNOSIS — S32591A Other specified fracture of right pubis, initial encounter for closed fracture: Secondary | ICD-10-CM | POA: Diagnosis not present

## 2021-01-05 DIAGNOSIS — S32401A Unspecified fracture of right acetabulum, initial encounter for closed fracture: Secondary | ICD-10-CM | POA: Diagnosis not present

## 2021-01-05 DIAGNOSIS — W19XXXA Unspecified fall, initial encounter: Secondary | ICD-10-CM | POA: Diagnosis not present

## 2021-01-05 DIAGNOSIS — S22060D Wedge compression fracture of T7-T8 vertebra, subsequent encounter for fracture with routine healing: Secondary | ICD-10-CM | POA: Diagnosis not present

## 2021-01-05 DIAGNOSIS — E559 Vitamin D deficiency, unspecified: Secondary | ICD-10-CM | POA: Diagnosis not present

## 2021-01-05 DIAGNOSIS — E785 Hyperlipidemia, unspecified: Secondary | ICD-10-CM | POA: Diagnosis not present

## 2021-01-05 DIAGNOSIS — I129 Hypertensive chronic kidney disease with stage 1 through stage 4 chronic kidney disease, or unspecified chronic kidney disease: Secondary | ICD-10-CM | POA: Diagnosis not present

## 2021-01-05 DIAGNOSIS — I10 Essential (primary) hypertension: Secondary | ICD-10-CM | POA: Diagnosis not present

## 2021-01-05 DIAGNOSIS — R1312 Dysphagia, oropharyngeal phase: Secondary | ICD-10-CM | POA: Diagnosis not present

## 2021-01-05 DIAGNOSIS — Z0000 Encounter for general adult medical examination without abnormal findings: Secondary | ICD-10-CM | POA: Diagnosis not present

## 2021-01-05 DIAGNOSIS — Z789 Other specified health status: Secondary | ICD-10-CM | POA: Diagnosis not present

## 2021-01-05 DIAGNOSIS — R5381 Other malaise: Secondary | ICD-10-CM | POA: Diagnosis not present

## 2021-01-05 DIAGNOSIS — S22080D Wedge compression fracture of T11-T12 vertebra, subsequent encounter for fracture with routine healing: Secondary | ICD-10-CM | POA: Diagnosis not present

## 2021-01-05 DIAGNOSIS — N183 Chronic kidney disease, stage 3 unspecified: Secondary | ICD-10-CM | POA: Diagnosis not present

## 2021-01-06 DIAGNOSIS — R1312 Dysphagia, oropharyngeal phase: Secondary | ICD-10-CM | POA: Diagnosis not present

## 2021-01-06 DIAGNOSIS — I10 Essential (primary) hypertension: Secondary | ICD-10-CM | POA: Diagnosis not present

## 2021-01-06 DIAGNOSIS — W19XXXA Unspecified fall, initial encounter: Secondary | ICD-10-CM | POA: Diagnosis not present

## 2021-01-06 DIAGNOSIS — S32401A Unspecified fracture of right acetabulum, initial encounter for closed fracture: Secondary | ICD-10-CM | POA: Diagnosis not present

## 2021-01-06 DIAGNOSIS — S22060D Wedge compression fracture of T7-T8 vertebra, subsequent encounter for fracture with routine healing: Secondary | ICD-10-CM | POA: Diagnosis not present

## 2021-01-06 DIAGNOSIS — S32591A Other specified fracture of right pubis, initial encounter for closed fracture: Secondary | ICD-10-CM | POA: Diagnosis not present

## 2021-01-06 DIAGNOSIS — S22080D Wedge compression fracture of T11-T12 vertebra, subsequent encounter for fracture with routine healing: Secondary | ICD-10-CM | POA: Diagnosis not present

## 2021-01-06 DIAGNOSIS — Z789 Other specified health status: Secondary | ICD-10-CM | POA: Diagnosis not present

## 2021-01-07 DIAGNOSIS — I10 Essential (primary) hypertension: Secondary | ICD-10-CM | POA: Diagnosis not present

## 2021-01-07 DIAGNOSIS — E785 Hyperlipidemia, unspecified: Secondary | ICD-10-CM | POA: Diagnosis not present

## 2021-01-07 DIAGNOSIS — S32591A Other specified fracture of right pubis, initial encounter for closed fracture: Secondary | ICD-10-CM | POA: Diagnosis not present

## 2021-01-07 DIAGNOSIS — R1312 Dysphagia, oropharyngeal phase: Secondary | ICD-10-CM | POA: Diagnosis not present

## 2021-01-07 DIAGNOSIS — M810 Age-related osteoporosis without current pathological fracture: Secondary | ICD-10-CM | POA: Diagnosis not present

## 2021-01-07 DIAGNOSIS — S22080D Wedge compression fracture of T11-T12 vertebra, subsequent encounter for fracture with routine healing: Secondary | ICD-10-CM | POA: Diagnosis not present

## 2021-01-07 DIAGNOSIS — W19XXXA Unspecified fall, initial encounter: Secondary | ICD-10-CM | POA: Diagnosis not present

## 2021-01-07 DIAGNOSIS — S32401A Unspecified fracture of right acetabulum, initial encounter for closed fracture: Secondary | ICD-10-CM | POA: Diagnosis not present

## 2021-01-07 DIAGNOSIS — S22060D Wedge compression fracture of T7-T8 vertebra, subsequent encounter for fracture with routine healing: Secondary | ICD-10-CM | POA: Diagnosis not present

## 2021-01-07 DIAGNOSIS — J4520 Mild intermittent asthma, uncomplicated: Secondary | ICD-10-CM | POA: Diagnosis not present

## 2021-01-07 DIAGNOSIS — K219 Gastro-esophageal reflux disease without esophagitis: Secondary | ICD-10-CM | POA: Diagnosis not present

## 2021-01-07 DIAGNOSIS — N183 Chronic kidney disease, stage 3 unspecified: Secondary | ICD-10-CM | POA: Diagnosis not present

## 2021-01-07 DIAGNOSIS — Z789 Other specified health status: Secondary | ICD-10-CM | POA: Diagnosis not present

## 2021-01-07 DIAGNOSIS — I129 Hypertensive chronic kidney disease with stage 1 through stage 4 chronic kidney disease, or unspecified chronic kidney disease: Secondary | ICD-10-CM | POA: Diagnosis not present

## 2021-01-11 DIAGNOSIS — S32591A Other specified fracture of right pubis, initial encounter for closed fracture: Secondary | ICD-10-CM | POA: Diagnosis not present

## 2021-01-11 DIAGNOSIS — Z789 Other specified health status: Secondary | ICD-10-CM | POA: Diagnosis not present

## 2021-01-11 DIAGNOSIS — S22060D Wedge compression fracture of T7-T8 vertebra, subsequent encounter for fracture with routine healing: Secondary | ICD-10-CM | POA: Diagnosis not present

## 2021-01-11 DIAGNOSIS — R1312 Dysphagia, oropharyngeal phase: Secondary | ICD-10-CM | POA: Diagnosis not present

## 2021-01-11 DIAGNOSIS — S32401A Unspecified fracture of right acetabulum, initial encounter for closed fracture: Secondary | ICD-10-CM | POA: Diagnosis not present

## 2021-01-11 DIAGNOSIS — W19XXXA Unspecified fall, initial encounter: Secondary | ICD-10-CM | POA: Diagnosis not present

## 2021-01-11 DIAGNOSIS — S22080D Wedge compression fracture of T11-T12 vertebra, subsequent encounter for fracture with routine healing: Secondary | ICD-10-CM | POA: Diagnosis not present

## 2021-01-11 DIAGNOSIS — I10 Essential (primary) hypertension: Secondary | ICD-10-CM | POA: Diagnosis not present

## 2021-01-13 DIAGNOSIS — Z789 Other specified health status: Secondary | ICD-10-CM | POA: Diagnosis not present

## 2021-01-13 DIAGNOSIS — R1312 Dysphagia, oropharyngeal phase: Secondary | ICD-10-CM | POA: Diagnosis not present

## 2021-01-13 DIAGNOSIS — I10 Essential (primary) hypertension: Secondary | ICD-10-CM | POA: Diagnosis not present

## 2021-01-13 DIAGNOSIS — S32591A Other specified fracture of right pubis, initial encounter for closed fracture: Secondary | ICD-10-CM | POA: Diagnosis not present

## 2021-01-13 DIAGNOSIS — W19XXXA Unspecified fall, initial encounter: Secondary | ICD-10-CM | POA: Diagnosis not present

## 2021-01-13 DIAGNOSIS — S22080D Wedge compression fracture of T11-T12 vertebra, subsequent encounter for fracture with routine healing: Secondary | ICD-10-CM | POA: Diagnosis not present

## 2021-01-13 DIAGNOSIS — S22060D Wedge compression fracture of T7-T8 vertebra, subsequent encounter for fracture with routine healing: Secondary | ICD-10-CM | POA: Diagnosis not present

## 2021-01-13 DIAGNOSIS — S32401A Unspecified fracture of right acetabulum, initial encounter for closed fracture: Secondary | ICD-10-CM | POA: Diagnosis not present

## 2021-01-17 DIAGNOSIS — S22080D Wedge compression fracture of T11-T12 vertebra, subsequent encounter for fracture with routine healing: Secondary | ICD-10-CM | POA: Diagnosis not present

## 2021-01-17 DIAGNOSIS — S22060D Wedge compression fracture of T7-T8 vertebra, subsequent encounter for fracture with routine healing: Secondary | ICD-10-CM | POA: Diagnosis not present

## 2021-01-17 DIAGNOSIS — S32591A Other specified fracture of right pubis, initial encounter for closed fracture: Secondary | ICD-10-CM | POA: Diagnosis not present

## 2021-01-17 DIAGNOSIS — R1312 Dysphagia, oropharyngeal phase: Secondary | ICD-10-CM | POA: Diagnosis not present

## 2021-01-17 DIAGNOSIS — I10 Essential (primary) hypertension: Secondary | ICD-10-CM | POA: Diagnosis not present

## 2021-01-17 DIAGNOSIS — Z789 Other specified health status: Secondary | ICD-10-CM | POA: Diagnosis not present

## 2021-01-17 DIAGNOSIS — S32401A Unspecified fracture of right acetabulum, initial encounter for closed fracture: Secondary | ICD-10-CM | POA: Diagnosis not present

## 2021-01-17 DIAGNOSIS — W19XXXA Unspecified fall, initial encounter: Secondary | ICD-10-CM | POA: Diagnosis not present

## 2021-01-25 DIAGNOSIS — Z789 Other specified health status: Secondary | ICD-10-CM | POA: Diagnosis not present

## 2021-01-25 DIAGNOSIS — S32591A Other specified fracture of right pubis, initial encounter for closed fracture: Secondary | ICD-10-CM | POA: Diagnosis not present

## 2021-01-25 DIAGNOSIS — S32401A Unspecified fracture of right acetabulum, initial encounter for closed fracture: Secondary | ICD-10-CM | POA: Diagnosis not present

## 2021-01-25 DIAGNOSIS — W19XXXA Unspecified fall, initial encounter: Secondary | ICD-10-CM | POA: Diagnosis not present

## 2021-01-25 DIAGNOSIS — I10 Essential (primary) hypertension: Secondary | ICD-10-CM | POA: Diagnosis not present

## 2021-01-25 DIAGNOSIS — S22080D Wedge compression fracture of T11-T12 vertebra, subsequent encounter for fracture with routine healing: Secondary | ICD-10-CM | POA: Diagnosis not present

## 2021-01-25 DIAGNOSIS — R1312 Dysphagia, oropharyngeal phase: Secondary | ICD-10-CM | POA: Diagnosis not present

## 2021-01-25 DIAGNOSIS — S22060D Wedge compression fracture of T7-T8 vertebra, subsequent encounter for fracture with routine healing: Secondary | ICD-10-CM | POA: Diagnosis not present

## 2021-01-31 DIAGNOSIS — M25551 Pain in right hip: Secondary | ICD-10-CM | POA: Diagnosis not present

## 2021-01-31 DIAGNOSIS — M545 Low back pain, unspecified: Secondary | ICD-10-CM | POA: Diagnosis not present

## 2021-02-07 DIAGNOSIS — S3210XK Unspecified fracture of sacrum, subsequent encounter for fracture with nonunion: Secondary | ICD-10-CM | POA: Diagnosis not present

## 2021-02-07 DIAGNOSIS — N183 Chronic kidney disease, stage 3 unspecified: Secondary | ICD-10-CM | POA: Diagnosis not present

## 2021-02-07 DIAGNOSIS — I129 Hypertensive chronic kidney disease with stage 1 through stage 4 chronic kidney disease, or unspecified chronic kidney disease: Secondary | ICD-10-CM | POA: Diagnosis not present

## 2021-02-07 DIAGNOSIS — S22000D Wedge compression fracture of unspecified thoracic vertebra, subsequent encounter for fracture with routine healing: Secondary | ICD-10-CM | POA: Diagnosis not present

## 2021-02-07 DIAGNOSIS — J4520 Mild intermittent asthma, uncomplicated: Secondary | ICD-10-CM | POA: Diagnosis not present

## 2021-02-07 DIAGNOSIS — M1380 Other specified arthritis, unspecified site: Secondary | ICD-10-CM | POA: Diagnosis not present

## 2021-02-07 DIAGNOSIS — F32A Depression, unspecified: Secondary | ICD-10-CM | POA: Diagnosis not present

## 2021-02-07 DIAGNOSIS — I10 Essential (primary) hypertension: Secondary | ICD-10-CM | POA: Diagnosis not present

## 2021-02-07 DIAGNOSIS — K219 Gastro-esophageal reflux disease without esophagitis: Secondary | ICD-10-CM | POA: Diagnosis not present

## 2021-02-07 DIAGNOSIS — M810 Age-related osteoporosis without current pathological fracture: Secondary | ICD-10-CM | POA: Diagnosis not present

## 2021-02-07 DIAGNOSIS — E785 Hyperlipidemia, unspecified: Secondary | ICD-10-CM | POA: Diagnosis not present

## 2021-02-07 DIAGNOSIS — Z7982 Long term (current) use of aspirin: Secondary | ICD-10-CM | POA: Diagnosis not present

## 2021-02-07 DIAGNOSIS — S3289XD Fracture of other parts of pelvis, subsequent encounter for fracture with routine healing: Secondary | ICD-10-CM | POA: Diagnosis not present

## 2021-02-08 DIAGNOSIS — S32401A Unspecified fracture of right acetabulum, initial encounter for closed fracture: Secondary | ICD-10-CM | POA: Diagnosis not present

## 2021-02-08 DIAGNOSIS — F03A18 Unspecified dementia, mild, with other behavioral disturbance: Secondary | ICD-10-CM | POA: Diagnosis not present

## 2021-02-09 DIAGNOSIS — S3210XK Unspecified fracture of sacrum, subsequent encounter for fracture with nonunion: Secondary | ICD-10-CM | POA: Diagnosis not present

## 2021-02-09 DIAGNOSIS — I10 Essential (primary) hypertension: Secondary | ICD-10-CM | POA: Diagnosis not present

## 2021-02-09 DIAGNOSIS — F32A Depression, unspecified: Secondary | ICD-10-CM | POA: Diagnosis not present

## 2021-02-09 DIAGNOSIS — Z7982 Long term (current) use of aspirin: Secondary | ICD-10-CM | POA: Diagnosis not present

## 2021-02-09 DIAGNOSIS — S22000D Wedge compression fracture of unspecified thoracic vertebra, subsequent encounter for fracture with routine healing: Secondary | ICD-10-CM | POA: Diagnosis not present

## 2021-02-09 DIAGNOSIS — S3289XD Fracture of other parts of pelvis, subsequent encounter for fracture with routine healing: Secondary | ICD-10-CM | POA: Diagnosis not present

## 2021-02-09 DIAGNOSIS — M1380 Other specified arthritis, unspecified site: Secondary | ICD-10-CM | POA: Diagnosis not present

## 2021-02-09 DIAGNOSIS — E785 Hyperlipidemia, unspecified: Secondary | ICD-10-CM | POA: Diagnosis not present

## 2021-02-09 DIAGNOSIS — K219 Gastro-esophageal reflux disease without esophagitis: Secondary | ICD-10-CM | POA: Diagnosis not present

## 2021-02-15 DIAGNOSIS — S22000D Wedge compression fracture of unspecified thoracic vertebra, subsequent encounter for fracture with routine healing: Secondary | ICD-10-CM | POA: Diagnosis not present

## 2021-02-15 DIAGNOSIS — S3289XD Fracture of other parts of pelvis, subsequent encounter for fracture with routine healing: Secondary | ICD-10-CM | POA: Diagnosis not present

## 2021-02-15 DIAGNOSIS — I10 Essential (primary) hypertension: Secondary | ICD-10-CM | POA: Diagnosis not present

## 2021-02-15 DIAGNOSIS — K219 Gastro-esophageal reflux disease without esophagitis: Secondary | ICD-10-CM | POA: Diagnosis not present

## 2021-02-15 DIAGNOSIS — E785 Hyperlipidemia, unspecified: Secondary | ICD-10-CM | POA: Diagnosis not present

## 2021-02-15 DIAGNOSIS — M1380 Other specified arthritis, unspecified site: Secondary | ICD-10-CM | POA: Diagnosis not present

## 2021-02-15 DIAGNOSIS — Z7982 Long term (current) use of aspirin: Secondary | ICD-10-CM | POA: Diagnosis not present

## 2021-02-15 DIAGNOSIS — S3210XK Unspecified fracture of sacrum, subsequent encounter for fracture with nonunion: Secondary | ICD-10-CM | POA: Diagnosis not present

## 2021-02-15 DIAGNOSIS — F32A Depression, unspecified: Secondary | ICD-10-CM | POA: Diagnosis not present

## 2021-02-16 ENCOUNTER — Encounter (HOSPITAL_BASED_OUTPATIENT_CLINIC_OR_DEPARTMENT_OTHER): Payer: Self-pay

## 2021-02-16 ENCOUNTER — Emergency Department (HOSPITAL_BASED_OUTPATIENT_CLINIC_OR_DEPARTMENT_OTHER)
Admission: EM | Admit: 2021-02-16 | Discharge: 2021-02-16 | Disposition: A | Discharge status: Home / Self Care | Payer: Medicare Other | Attending: Emergency Medicine | Admitting: Emergency Medicine

## 2021-02-16 ENCOUNTER — Other Ambulatory Visit: Payer: Self-pay

## 2021-02-16 ENCOUNTER — Emergency Department (HOSPITAL_BASED_OUTPATIENT_CLINIC_OR_DEPARTMENT_OTHER): Payer: Medicare Other

## 2021-02-16 DIAGNOSIS — J45909 Unspecified asthma, uncomplicated: Secondary | ICD-10-CM | POA: Diagnosis not present

## 2021-02-16 DIAGNOSIS — Z7982 Long term (current) use of aspirin: Secondary | ICD-10-CM | POA: Diagnosis not present

## 2021-02-16 DIAGNOSIS — Z85820 Personal history of malignant melanoma of skin: Secondary | ICD-10-CM | POA: Insufficient documentation

## 2021-02-16 DIAGNOSIS — I129 Hypertensive chronic kidney disease with stage 1 through stage 4 chronic kidney disease, or unspecified chronic kidney disease: Secondary | ICD-10-CM | POA: Diagnosis not present

## 2021-02-16 DIAGNOSIS — I10 Essential (primary) hypertension: Secondary | ICD-10-CM

## 2021-02-16 DIAGNOSIS — Z79899 Other long term (current) drug therapy: Secondary | ICD-10-CM | POA: Insufficient documentation

## 2021-02-16 DIAGNOSIS — R0602 Shortness of breath: Secondary | ICD-10-CM | POA: Insufficient documentation

## 2021-02-16 DIAGNOSIS — N183 Chronic kidney disease, stage 3 unspecified: Secondary | ICD-10-CM | POA: Diagnosis not present

## 2021-02-16 LAB — CBC WITH DIFFERENTIAL/PLATELET
Abs Immature Granulocytes: 0.05 10*3/uL (ref 0.00–0.07)
Basophils Absolute: 0.1 10*3/uL (ref 0.0–0.1)
Basophils Relative: 1 %
Eosinophils Absolute: 1.1 10*3/uL — ABNORMAL HIGH (ref 0.0–0.5)
Eosinophils Relative: 12 %
HCT: 33.2 % — ABNORMAL LOW (ref 36.0–46.0)
Hemoglobin: 10.7 g/dL — ABNORMAL LOW (ref 12.0–15.0)
Immature Granulocytes: 1 %
Lymphocytes Relative: 38 %
Lymphs Abs: 3.6 10*3/uL (ref 0.7–4.0)
MCH: 31.9 pg (ref 26.0–34.0)
MCHC: 32.2 g/dL (ref 30.0–36.0)
MCV: 99.1 fL (ref 80.0–100.0)
Monocytes Absolute: 0.7 10*3/uL (ref 0.1–1.0)
Monocytes Relative: 7 %
Neutro Abs: 3.9 10*3/uL (ref 1.7–7.7)
Neutrophils Relative %: 41 %
Platelets: 181 10*3/uL (ref 150–400)
RBC: 3.35 MIL/uL — ABNORMAL LOW (ref 3.87–5.11)
RDW: 13.3 % (ref 11.5–15.5)
WBC: 9.4 10*3/uL (ref 4.0–10.5)
nRBC: 0 % (ref 0.0–0.2)

## 2021-02-16 LAB — COMPREHENSIVE METABOLIC PANEL
ALT: 8 U/L (ref 0–44)
AST: 15 U/L (ref 15–41)
Albumin: 3.4 g/dL — ABNORMAL LOW (ref 3.5–5.0)
Alkaline Phosphatase: 68 U/L (ref 38–126)
Anion gap: 6 (ref 5–15)
BUN: 15 mg/dL (ref 8–23)
CO2: 28 mmol/L (ref 22–32)
Calcium: 9.6 mg/dL (ref 8.9–10.3)
Chloride: 106 mmol/L (ref 98–111)
Creatinine, Ser: 1.31 mg/dL — ABNORMAL HIGH (ref 0.44–1.00)
GFR, Estimated: 40 mL/min — ABNORMAL LOW (ref >60)
Glucose, Bld: 132 mg/dL — ABNORMAL HIGH (ref 70–99)
Potassium: 3.9 mmol/L (ref 3.5–5.1)
Sodium: 140 mmol/L (ref 135–145)
Total Bilirubin: 0.4 mg/dL (ref 0.3–1.2)
Total Protein: 6.4 g/dL — ABNORMAL LOW (ref 6.5–8.1)

## 2021-02-16 LAB — URINALYSIS, ROUTINE W REFLEX MICROSCOPIC
Bilirubin Urine: NEGATIVE
Glucose, UA: NEGATIVE mg/dL
Hgb urine dipstick: NEGATIVE
Ketones, ur: NEGATIVE mg/dL
Leukocytes,Ua: NEGATIVE
Nitrite: NEGATIVE
Protein, ur: NEGATIVE mg/dL
Specific Gravity, Urine: 1.02 (ref 1.005–1.030)
pH: 6 (ref 5.0–8.0)

## 2021-02-16 LAB — BRAIN NATRIURETIC PEPTIDE: B Natriuretic Peptide: 59.8 pg/mL (ref 0.0–100.0)

## 2021-02-16 NOTE — ED Triage Notes (Signed)
Per daughter/pt with dementia hx-pt with elevated BP, abd swelling and facial swelling-sx started last night-PCP changed BP med over the phone today-was not seen in the office-NAD-to triage in w/c

## 2021-02-16 NOTE — ED Notes (Signed)
Patient assisted to bathroom requiring wheelchair and 1 person assist.  Urine specimen clean catch collected and taken to lab for analysis.

## 2021-02-16 NOTE — ED Notes (Signed)
Patient discharged to home.  All discharge instructions reviewed.  Patient verbalized understanding via teachback method.  VS WDL.  Respirations even and unlabored.  Wheelchair out of ED.

## 2021-02-16 NOTE — ED Provider Notes (Signed)
[facility] Provider Note  CSN: [id number] Arrival date & time: 02/16/21 2028    History Chief Complaint  Patient presents with   Hypertension    Kristen Mclaughlin is a 87 y.o. female with history of CKD, HTN and dementia brought to the ED by daughter for evaluation of HTN. She noticed the patient was running higher than usual blood pressures yesterday. She was having a mild headache but otherwise in her usual state of health. Daughter noticed that her face and abdomen seemed swollen, but no leg swelling or chest pain. Maybe some SOB but no fevr or cough. She has not had any N/V/D. She was treated for UTI recently and was scheduled for repeat labs in 2 days. Daughter called PCP who recommended she weigh the patient (daughter reports she was 140lbs, previous weight was 122lbs but that was not recent, unknown recent baseline). PCP changed her Losartan to Valsartan which she took the first dose of earlier today. Daughter reports BP improved during the day but increased again this evening and so she brought the patient for evaluation. The patient herself has no complaints.    Past Medical History:  Diagnosis Date   Anxiety    Asthma    CKD (chronic kidney disease), stage III (HCC)    Dementia ([place])    Dysphagia    Hearing loss    left ear deaf, heairng aid on right   Hyperlipidemia    Hypertension    MCI (mild cognitive impairment)    Melanoma (HCC)    hx of facial   Memory loss    Osteoporosis    Vitamin D deficiency     Past Surgical History:  Procedure Laterality Date   APPENDECTOMY     CATARACT EXTRACTION     KNEE SURGERY     right    Family History  Problem Relation Age of Onset   Alzheimer's disease Mother    Asthma Mother    Alzheimer's disease Sister    Asthma Sister     Social History   Tobacco Use   Smoking status: Never   Smokeless tobacco: Never  Substance Use Topics   Alcohol use: No    Alcohol/week: 0.0 standard  drinks   Drug use: No     Home Medications Prior to Admission medications   Medication Sig Start Date End Date Taking? Authorizing Provider  albuterol (PROVENTIL) (5 MG/ML) 0.5% nebulizer solution Take 0.5 mLs (2.5 mg total) by nebulization every 6 (six) hours as needed for wheezing or shortness of breath. 05/30/20   Drenda Freeze, MD  aspirin 81 MG tablet Take 81 mg by mouth daily.    [provider]  cefadroxil (DURICEF) 500 MG capsule Take 1 capsule (500 mg total) by mouth 2 (two) times daily. Patient not taking: Reported on 11/09/2020 09/02/20   Criselda Peaches, DPM  cetirizine (ZYRTEC) 10 MG tablet 1 tablet    [provider]  Cholecalciferol (VITAMIN D3) 125 MCG (5000 UT) CAPS 1 capsule    [provider]  Coenzyme Q10 (CO Q-10) 200 MG CAPS Take 200 mg by mouth daily. Patient not taking: Reported on 11/09/2020    [provider]  diclofenac sodium (VOLTAREN) 1 % GEL Apply topically. As Needed    [provider]  escitalopram (LEXAPRO) 10 MG tablet TK 1 T PO QD 10/20/17   [provider]  Ferrous Sulfate (IRON) 325 (65 Fe) MG TABS 1 tablet (65mg )  [provider]  fluticasone (FLONASE) 50 MCG/ACT nasal spray Place into both nostrils daily.    [provider]  hydrochlorothiazide (HYDRODIURIL) 12.5 MG tablet Take 12.5 mg by mouth every morning. 08/13/20   [provider]  losartan (COZAAR) 50 MG tablet Take 1 tablet by mouth every morning. 08/13/20   [provider]  memantine (NAMENDA) 10 MG tablet Take 1 tablet (10 mg total) by mouth 2 (two) times daily. 07/23/19   Penumalli, Earlean Polka, MD  pantoprazole (PROTONIX) 40 MG tablet Take 40 mg by mouth daily. Patient not taking: Reported on 11/09/2020 01/20/19   [provider]  pantoprazole (PROTONIX) 40 MG tablet Take 1 tablet by mouth every morning.    [provider]  simvastatin (ZOCOR) 40 MG tablet Take 40 mg by mouth daily.     [provider]  triamcinolone cream (KENALOG) 0.1 % APPLY EXTERNALLY TO THE AFFECTED AREA TWICE DAILY AS NEEDED 03/13/19   [provider]  Vitamin D, Ergocalciferol, (DRISDOL) 50000 UNITS CAPS capsule Take 50,000 Units by mouth. 1 capsule 1-2 times a week    [provider]  Vitamin E 200 units TABS 1 capsule    [provider]  vitamin E 400 UNIT capsule Take 400 Units by mouth daily.    [provider]     Allergies    Alendronate, Boniva [ibandronic acid], Bupropion, Colestipol, Colestipol hcl, Donepezil, Doxycycline, Lipitor [atorvastatin], Niacin and related, Pravastatin, Rivastigmine, Rosuvastatin, and Welchol [colesevelam]   Review of Systems   Review of Systems A comprehensive review of systems was completed and negative except as noted in HPI.    Physical Exam BP (!) 168/68 (BP Location: Right Arm)   Pulse 87   Temp 98.3 F (36.8 C) (Oral)   Resp 16   Ht 5' (1.524 m)   Wt 63.5 kg   SpO2 96%   BMI 27.34 kg/m   Physical Exam Vitals and nursing note reviewed.  Constitutional:      Appearance: Normal appearance.  HENT:     Head: Normocephalic and atraumatic.     Nose: Nose normal.     Mouth/Throat:     Mouth: Mucous membranes are moist.  Eyes:     Extraocular Movements: Extraocular movements intact.     Conjunctiva/sclera: Conjunctivae normal.  Cardiovascular:     Rate and Rhythm: Normal rate.  Pulmonary:     Effort: Pulmonary effort is normal.     Breath sounds: Normal breath sounds.  Abdominal:     General: Abdomen is flat.     Palpations: Abdomen is soft.     Tenderness: There is no abdominal tenderness. There is no guarding.  Musculoskeletal:        General: No swelling. Normal range of motion.     Cervical back: Neck supple.     Right lower leg: No edema.     Left lower leg: No edema.  Skin:    General: Skin is warm and dry.  Neurological:     General: No focal deficit present.     Mental Status: She  is alert.  Psychiatric:        Mood and Affect: Mood normal.     ED Results / Procedures / Treatments   Labs (all labs ordered are listed, but only abnormal results are displayed) Labs Reviewed  COMPREHENSIVE METABOLIC PANEL - Abnormal; Notable for the following components:      Result Value   Glucose, Bld 132 (*)    Creatinine,  Ser 1.31 (*)    Total Protein 6.4 (*)    Albumin 3.4 (*)    GFR, Estimated 40 (*)    All other components within normal limits  CBC WITH DIFFERENTIAL/PLATELET - Abnormal; Notable for the following components:   RBC 3.35 (*)    Hemoglobin 10.7 (*)    HCT 33.2 (*)    Eosinophils Absolute 1.1 (*)    All other components within normal limits  URINALYSIS, ROUTINE W REFLEX MICROSCOPIC  BRAIN NATRIURETIC PEPTIDE    EKG EKG Interpretation  Date/Time:  Wednesday February 16 2021 21:13:17 EST Ventricular Rate:  86 PR Interval:  159 QRS Duration: 77 QT Interval:  375 QTC Calculation: 449 R Axis:   61 Text Interpretation: Sinus rhythm Abnormal R-wave progression, early transition Minimal ST depression, lateral leads No significant change since last tracing Confirmed by Calvert Cantor [phone]) on 02/16/2021 9:22:16 PM   Radiology DG Chest 2 View  Result Date: 02/16/2021 CLINICAL DATA:  Hypertension, facial swelling, short of breath EXAM: CHEST - 2 VIEW COMPARISON:  11/19/2020 FINDINGS: Frontal and lateral views of the chest demonstrate a stable cardiac silhouette. No acute airspace disease, effusion, or pneumothorax. Chronic wedge compression deformities are seen at T4 and T8. No acute fractures. IMPRESSION: 1. No acute intrathoracic process. Electronically Signed   By: Randa Ngo M.D.   On: 02/16/2021 22:19    Procedures Procedures  Medications Ordered in the ED Medications - No data to display   MDM Rules/Calculators/A&P MDM Patient is hypertensive here but symptoms are minimal if any. I do not appreciate any significant peripheral edema or  other signs of fluid overload. Will check labs, CXR and EKG to evaluate for signs of end organ damage.   ED Course  I have reviewed the triage vital signs and the nursing notes.  Pertinent labs & imaging results that were available during my care of the patient were reviewed by me and considered in my medical decision making (see chart for details).  Clinical Course as of 02/16/21 2237  Wed Feb 16, 2021  2154 CMP with stable CKD. CBC with mild anemia.  [CS]  2207 BNP is normal.  [CS]  2223 CXR is clear.  [CS]  2229 UA is negative.  [CS]  7048 BP is improved without intervention. Labs and imaging do not show any signs of end organ damage. Recommend continued home management, call PCP if not improved by next Monday.  [CS]    Clinical Course User Index [CS] Truddie Hidden, MD    Final Clinical Impression(s) / ED Diagnoses Final diagnoses:  Hypertension, unspecified type    Rx / DC Orders ED Discharge Orders     None        Truddie Hidden, MD 02/16/21 2237

## 2021-02-18 DIAGNOSIS — S3289XD Fracture of other parts of pelvis, subsequent encounter for fracture with routine healing: Secondary | ICD-10-CM | POA: Diagnosis not present

## 2021-02-18 DIAGNOSIS — M1380 Other specified arthritis, unspecified site: Secondary | ICD-10-CM | POA: Diagnosis not present

## 2021-02-18 DIAGNOSIS — S3210XK Unspecified fracture of sacrum, subsequent encounter for fracture with nonunion: Secondary | ICD-10-CM | POA: Diagnosis not present

## 2021-02-18 DIAGNOSIS — I10 Essential (primary) hypertension: Secondary | ICD-10-CM | POA: Diagnosis not present

## 2021-02-18 DIAGNOSIS — E785 Hyperlipidemia, unspecified: Secondary | ICD-10-CM | POA: Diagnosis not present

## 2021-02-18 DIAGNOSIS — Z7982 Long term (current) use of aspirin: Secondary | ICD-10-CM | POA: Diagnosis not present

## 2021-02-18 DIAGNOSIS — S22000D Wedge compression fracture of unspecified thoracic vertebra, subsequent encounter for fracture with routine healing: Secondary | ICD-10-CM | POA: Diagnosis not present

## 2021-02-18 DIAGNOSIS — F32A Depression, unspecified: Secondary | ICD-10-CM | POA: Diagnosis not present

## 2021-02-18 DIAGNOSIS — K219 Gastro-esophageal reflux disease without esophagitis: Secondary | ICD-10-CM | POA: Diagnosis not present

## 2021-02-22 DIAGNOSIS — S22000D Wedge compression fracture of unspecified thoracic vertebra, subsequent encounter for fracture with routine healing: Secondary | ICD-10-CM | POA: Diagnosis not present

## 2021-02-22 DIAGNOSIS — Z7982 Long term (current) use of aspirin: Secondary | ICD-10-CM | POA: Diagnosis not present

## 2021-02-22 DIAGNOSIS — F32A Depression, unspecified: Secondary | ICD-10-CM | POA: Diagnosis not present

## 2021-02-22 DIAGNOSIS — S3289XD Fracture of other parts of pelvis, subsequent encounter for fracture with routine healing: Secondary | ICD-10-CM | POA: Diagnosis not present

## 2021-02-22 DIAGNOSIS — I10 Essential (primary) hypertension: Secondary | ICD-10-CM | POA: Diagnosis not present

## 2021-02-22 DIAGNOSIS — S3210XK Unspecified fracture of sacrum, subsequent encounter for fracture with nonunion: Secondary | ICD-10-CM | POA: Diagnosis not present

## 2021-02-22 DIAGNOSIS — M1380 Other specified arthritis, unspecified site: Secondary | ICD-10-CM | POA: Diagnosis not present

## 2021-02-22 DIAGNOSIS — K219 Gastro-esophageal reflux disease without esophagitis: Secondary | ICD-10-CM | POA: Diagnosis not present

## 2021-02-22 DIAGNOSIS — E785 Hyperlipidemia, unspecified: Secondary | ICD-10-CM | POA: Diagnosis not present

## 2021-02-23 DIAGNOSIS — S22000D Wedge compression fracture of unspecified thoracic vertebra, subsequent encounter for fracture with routine healing: Secondary | ICD-10-CM | POA: Diagnosis not present

## 2021-02-23 DIAGNOSIS — M1380 Other specified arthritis, unspecified site: Secondary | ICD-10-CM | POA: Diagnosis not present

## 2021-02-23 DIAGNOSIS — K219 Gastro-esophageal reflux disease without esophagitis: Secondary | ICD-10-CM | POA: Diagnosis not present

## 2021-02-23 DIAGNOSIS — Z7982 Long term (current) use of aspirin: Secondary | ICD-10-CM | POA: Diagnosis not present

## 2021-02-23 DIAGNOSIS — I10 Essential (primary) hypertension: Secondary | ICD-10-CM | POA: Diagnosis not present

## 2021-02-23 DIAGNOSIS — E785 Hyperlipidemia, unspecified: Secondary | ICD-10-CM | POA: Diagnosis not present

## 2021-02-23 DIAGNOSIS — F32A Depression, unspecified: Secondary | ICD-10-CM | POA: Diagnosis not present

## 2021-02-23 DIAGNOSIS — S3210XK Unspecified fracture of sacrum, subsequent encounter for fracture with nonunion: Secondary | ICD-10-CM | POA: Diagnosis not present

## 2021-02-23 DIAGNOSIS — S3289XD Fracture of other parts of pelvis, subsequent encounter for fracture with routine healing: Secondary | ICD-10-CM | POA: Diagnosis not present

## 2021-02-25 DIAGNOSIS — S22000D Wedge compression fracture of unspecified thoracic vertebra, subsequent encounter for fracture with routine healing: Secondary | ICD-10-CM | POA: Diagnosis not present

## 2021-02-25 DIAGNOSIS — S3289XD Fracture of other parts of pelvis, subsequent encounter for fracture with routine healing: Secondary | ICD-10-CM | POA: Diagnosis not present

## 2021-02-25 DIAGNOSIS — Z7982 Long term (current) use of aspirin: Secondary | ICD-10-CM | POA: Diagnosis not present

## 2021-02-25 DIAGNOSIS — I10 Essential (primary) hypertension: Secondary | ICD-10-CM | POA: Diagnosis not present

## 2021-02-25 DIAGNOSIS — M1380 Other specified arthritis, unspecified site: Secondary | ICD-10-CM | POA: Diagnosis not present

## 2021-02-25 DIAGNOSIS — S3210XK Unspecified fracture of sacrum, subsequent encounter for fracture with nonunion: Secondary | ICD-10-CM | POA: Diagnosis not present

## 2021-02-25 DIAGNOSIS — F32A Depression, unspecified: Secondary | ICD-10-CM | POA: Diagnosis not present

## 2021-02-25 DIAGNOSIS — K219 Gastro-esophageal reflux disease without esophagitis: Secondary | ICD-10-CM | POA: Diagnosis not present

## 2021-02-25 DIAGNOSIS — E785 Hyperlipidemia, unspecified: Secondary | ICD-10-CM | POA: Diagnosis not present

## 2021-02-28 DIAGNOSIS — S22000D Wedge compression fracture of unspecified thoracic vertebra, subsequent encounter for fracture with routine healing: Secondary | ICD-10-CM | POA: Diagnosis not present

## 2021-02-28 DIAGNOSIS — M1380 Other specified arthritis, unspecified site: Secondary | ICD-10-CM | POA: Diagnosis not present

## 2021-02-28 DIAGNOSIS — F32A Depression, unspecified: Secondary | ICD-10-CM | POA: Diagnosis not present

## 2021-02-28 DIAGNOSIS — I10 Essential (primary) hypertension: Secondary | ICD-10-CM | POA: Diagnosis not present

## 2021-02-28 DIAGNOSIS — S3289XD Fracture of other parts of pelvis, subsequent encounter for fracture with routine healing: Secondary | ICD-10-CM | POA: Diagnosis not present

## 2021-02-28 DIAGNOSIS — K219 Gastro-esophageal reflux disease without esophagitis: Secondary | ICD-10-CM | POA: Diagnosis not present

## 2021-02-28 DIAGNOSIS — Z7982 Long term (current) use of aspirin: Secondary | ICD-10-CM | POA: Diagnosis not present

## 2021-02-28 DIAGNOSIS — E785 Hyperlipidemia, unspecified: Secondary | ICD-10-CM | POA: Diagnosis not present

## 2021-02-28 DIAGNOSIS — S3210XK Unspecified fracture of sacrum, subsequent encounter for fracture with nonunion: Secondary | ICD-10-CM | POA: Diagnosis not present

## 2021-03-02 DIAGNOSIS — I10 Essential (primary) hypertension: Secondary | ICD-10-CM | POA: Diagnosis not present

## 2021-03-02 DIAGNOSIS — E785 Hyperlipidemia, unspecified: Secondary | ICD-10-CM | POA: Diagnosis not present

## 2021-03-02 DIAGNOSIS — M1380 Other specified arthritis, unspecified site: Secondary | ICD-10-CM | POA: Diagnosis not present

## 2021-03-02 DIAGNOSIS — M25551 Pain in right hip: Secondary | ICD-10-CM | POA: Diagnosis not present

## 2021-03-02 DIAGNOSIS — S22000D Wedge compression fracture of unspecified thoracic vertebra, subsequent encounter for fracture with routine healing: Secondary | ICD-10-CM | POA: Diagnosis not present

## 2021-03-02 DIAGNOSIS — I129 Hypertensive chronic kidney disease with stage 1 through stage 4 chronic kidney disease, or unspecified chronic kidney disease: Secondary | ICD-10-CM | POA: Diagnosis not present

## 2021-03-02 DIAGNOSIS — F32A Depression, unspecified: Secondary | ICD-10-CM | POA: Diagnosis not present

## 2021-03-02 DIAGNOSIS — S3210XK Unspecified fracture of sacrum, subsequent encounter for fracture with nonunion: Secondary | ICD-10-CM | POA: Diagnosis not present

## 2021-03-02 DIAGNOSIS — Z7982 Long term (current) use of aspirin: Secondary | ICD-10-CM | POA: Diagnosis not present

## 2021-03-02 DIAGNOSIS — S3289XD Fracture of other parts of pelvis, subsequent encounter for fracture with routine healing: Secondary | ICD-10-CM | POA: Diagnosis not present

## 2021-03-02 DIAGNOSIS — K219 Gastro-esophageal reflux disease without esophagitis: Secondary | ICD-10-CM | POA: Diagnosis not present

## 2021-03-03 DIAGNOSIS — M1380 Other specified arthritis, unspecified site: Secondary | ICD-10-CM | POA: Diagnosis not present

## 2021-03-03 DIAGNOSIS — S3210XK Unspecified fracture of sacrum, subsequent encounter for fracture with nonunion: Secondary | ICD-10-CM | POA: Diagnosis not present

## 2021-03-03 DIAGNOSIS — E785 Hyperlipidemia, unspecified: Secondary | ICD-10-CM | POA: Diagnosis not present

## 2021-03-03 DIAGNOSIS — I10 Essential (primary) hypertension: Secondary | ICD-10-CM | POA: Diagnosis not present

## 2021-03-03 DIAGNOSIS — S22000D Wedge compression fracture of unspecified thoracic vertebra, subsequent encounter for fracture with routine healing: Secondary | ICD-10-CM | POA: Diagnosis not present

## 2021-03-03 DIAGNOSIS — S3289XD Fracture of other parts of pelvis, subsequent encounter for fracture with routine healing: Secondary | ICD-10-CM | POA: Diagnosis not present

## 2021-03-03 DIAGNOSIS — F32A Depression, unspecified: Secondary | ICD-10-CM | POA: Diagnosis not present

## 2021-03-03 DIAGNOSIS — Z7982 Long term (current) use of aspirin: Secondary | ICD-10-CM | POA: Diagnosis not present

## 2021-03-03 DIAGNOSIS — K219 Gastro-esophageal reflux disease without esophagitis: Secondary | ICD-10-CM | POA: Diagnosis not present

## 2021-03-07 DIAGNOSIS — K219 Gastro-esophageal reflux disease without esophagitis: Secondary | ICD-10-CM | POA: Diagnosis not present

## 2021-03-07 DIAGNOSIS — S3210XK Unspecified fracture of sacrum, subsequent encounter for fracture with nonunion: Secondary | ICD-10-CM | POA: Diagnosis not present

## 2021-03-07 DIAGNOSIS — F32A Depression, unspecified: Secondary | ICD-10-CM | POA: Diagnosis not present

## 2021-03-07 DIAGNOSIS — I10 Essential (primary) hypertension: Secondary | ICD-10-CM | POA: Diagnosis not present

## 2021-03-07 DIAGNOSIS — M1380 Other specified arthritis, unspecified site: Secondary | ICD-10-CM | POA: Diagnosis not present

## 2021-03-07 DIAGNOSIS — S22000D Wedge compression fracture of unspecified thoracic vertebra, subsequent encounter for fracture with routine healing: Secondary | ICD-10-CM | POA: Diagnosis not present

## 2021-03-07 DIAGNOSIS — S3289XD Fracture of other parts of pelvis, subsequent encounter for fracture with routine healing: Secondary | ICD-10-CM | POA: Diagnosis not present

## 2021-03-07 DIAGNOSIS — Z7982 Long term (current) use of aspirin: Secondary | ICD-10-CM | POA: Diagnosis not present

## 2021-03-07 DIAGNOSIS — E785 Hyperlipidemia, unspecified: Secondary | ICD-10-CM | POA: Diagnosis not present

## 2021-03-09 DIAGNOSIS — J4520 Mild intermittent asthma, uncomplicated: Secondary | ICD-10-CM | POA: Diagnosis not present

## 2021-03-09 DIAGNOSIS — I129 Hypertensive chronic kidney disease with stage 1 through stage 4 chronic kidney disease, or unspecified chronic kidney disease: Secondary | ICD-10-CM | POA: Diagnosis not present

## 2021-03-09 DIAGNOSIS — I10 Essential (primary) hypertension: Secondary | ICD-10-CM | POA: Diagnosis not present

## 2021-03-09 DIAGNOSIS — M810 Age-related osteoporosis without current pathological fracture: Secondary | ICD-10-CM | POA: Diagnosis not present

## 2021-03-09 DIAGNOSIS — S3210XK Unspecified fracture of sacrum, subsequent encounter for fracture with nonunion: Secondary | ICD-10-CM | POA: Diagnosis not present

## 2021-03-09 DIAGNOSIS — N183 Chronic kidney disease, stage 3 unspecified: Secondary | ICD-10-CM | POA: Diagnosis not present

## 2021-03-09 DIAGNOSIS — K219 Gastro-esophageal reflux disease without esophagitis: Secondary | ICD-10-CM | POA: Diagnosis not present

## 2021-03-09 DIAGNOSIS — M1380 Other specified arthritis, unspecified site: Secondary | ICD-10-CM | POA: Diagnosis not present

## 2021-03-09 DIAGNOSIS — E785 Hyperlipidemia, unspecified: Secondary | ICD-10-CM | POA: Diagnosis not present

## 2021-03-09 DIAGNOSIS — S22000D Wedge compression fracture of unspecified thoracic vertebra, subsequent encounter for fracture with routine healing: Secondary | ICD-10-CM | POA: Diagnosis not present

## 2021-03-09 DIAGNOSIS — F32A Depression, unspecified: Secondary | ICD-10-CM | POA: Diagnosis not present

## 2021-03-09 DIAGNOSIS — Z7982 Long term (current) use of aspirin: Secondary | ICD-10-CM | POA: Diagnosis not present

## 2021-03-09 DIAGNOSIS — S3289XD Fracture of other parts of pelvis, subsequent encounter for fracture with routine healing: Secondary | ICD-10-CM | POA: Diagnosis not present

## 2021-03-14 DIAGNOSIS — S3289XD Fracture of other parts of pelvis, subsequent encounter for fracture with routine healing: Secondary | ICD-10-CM | POA: Diagnosis not present

## 2021-03-14 DIAGNOSIS — I10 Essential (primary) hypertension: Secondary | ICD-10-CM | POA: Diagnosis not present

## 2021-03-14 DIAGNOSIS — F32A Depression, unspecified: Secondary | ICD-10-CM | POA: Diagnosis not present

## 2021-03-14 DIAGNOSIS — M25551 Pain in right hip: Secondary | ICD-10-CM | POA: Diagnosis not present

## 2021-03-14 DIAGNOSIS — S22000D Wedge compression fracture of unspecified thoracic vertebra, subsequent encounter for fracture with routine healing: Secondary | ICD-10-CM | POA: Diagnosis not present

## 2021-03-14 DIAGNOSIS — M1380 Other specified arthritis, unspecified site: Secondary | ICD-10-CM | POA: Diagnosis not present

## 2021-03-14 DIAGNOSIS — E785 Hyperlipidemia, unspecified: Secondary | ICD-10-CM | POA: Diagnosis not present

## 2021-03-14 DIAGNOSIS — S3210XK Unspecified fracture of sacrum, subsequent encounter for fracture with nonunion: Secondary | ICD-10-CM | POA: Diagnosis not present

## 2021-03-14 DIAGNOSIS — K219 Gastro-esophageal reflux disease without esophagitis: Secondary | ICD-10-CM | POA: Diagnosis not present

## 2021-03-14 DIAGNOSIS — Z7982 Long term (current) use of aspirin: Secondary | ICD-10-CM | POA: Diagnosis not present

## 2021-03-22 DIAGNOSIS — I129 Hypertensive chronic kidney disease with stage 1 through stage 4 chronic kidney disease, or unspecified chronic kidney disease: Secondary | ICD-10-CM | POA: Diagnosis not present

## 2021-03-22 DIAGNOSIS — N1832 Chronic kidney disease, stage 3b: Secondary | ICD-10-CM | POA: Diagnosis not present

## 2021-03-22 DIAGNOSIS — M25551 Pain in right hip: Secondary | ICD-10-CM | POA: Diagnosis not present

## 2021-03-22 DIAGNOSIS — Z79899 Other long term (current) drug therapy: Secondary | ICD-10-CM | POA: Diagnosis not present

## 2021-03-22 DIAGNOSIS — F03911 Unspecified dementia, unspecified severity, with agitation: Secondary | ICD-10-CM | POA: Diagnosis not present

## 2021-03-22 DIAGNOSIS — Z0000 Encounter for general adult medical examination without abnormal findings: Secondary | ICD-10-CM | POA: Diagnosis not present

## 2021-03-22 DIAGNOSIS — Z7409 Other reduced mobility: Secondary | ICD-10-CM | POA: Diagnosis not present

## 2021-03-22 DIAGNOSIS — E559 Vitamin D deficiency, unspecified: Secondary | ICD-10-CM | POA: Diagnosis not present

## 2021-03-25 DIAGNOSIS — Z7409 Other reduced mobility: Secondary | ICD-10-CM | POA: Diagnosis not present

## 2021-03-25 DIAGNOSIS — Z0001 Encounter for general adult medical examination with abnormal findings: Secondary | ICD-10-CM | POA: Diagnosis not present

## 2021-03-25 DIAGNOSIS — I129 Hypertensive chronic kidney disease with stage 1 through stage 4 chronic kidney disease, or unspecified chronic kidney disease: Secondary | ICD-10-CM | POA: Diagnosis not present

## 2021-03-25 DIAGNOSIS — H6120 Impacted cerumen, unspecified ear: Secondary | ICD-10-CM | POA: Diagnosis not present

## 2021-03-25 DIAGNOSIS — D509 Iron deficiency anemia, unspecified: Secondary | ICD-10-CM | POA: Diagnosis not present

## 2021-03-25 DIAGNOSIS — Z0000 Encounter for general adult medical examination without abnormal findings: Secondary | ICD-10-CM | POA: Diagnosis not present

## 2021-03-25 DIAGNOSIS — G4700 Insomnia, unspecified: Secondary | ICD-10-CM | POA: Diagnosis not present

## 2021-03-25 DIAGNOSIS — N1832 Chronic kidney disease, stage 3b: Secondary | ICD-10-CM | POA: Diagnosis not present

## 2021-03-25 DIAGNOSIS — E785 Hyperlipidemia, unspecified: Secondary | ICD-10-CM | POA: Diagnosis not present

## 2021-04-12 DIAGNOSIS — E785 Hyperlipidemia, unspecified: Secondary | ICD-10-CM | POA: Diagnosis not present

## 2021-04-12 DIAGNOSIS — J4520 Mild intermittent asthma, uncomplicated: Secondary | ICD-10-CM | POA: Diagnosis not present

## 2021-04-12 DIAGNOSIS — M810 Age-related osteoporosis without current pathological fracture: Secondary | ICD-10-CM | POA: Diagnosis not present

## 2021-04-12 DIAGNOSIS — I129 Hypertensive chronic kidney disease with stage 1 through stage 4 chronic kidney disease, or unspecified chronic kidney disease: Secondary | ICD-10-CM | POA: Diagnosis not present

## 2021-04-12 DIAGNOSIS — K219 Gastro-esophageal reflux disease without esophagitis: Secondary | ICD-10-CM | POA: Diagnosis not present

## 2021-04-21 DIAGNOSIS — N1832 Chronic kidney disease, stage 3b: Secondary | ICD-10-CM | POA: Diagnosis not present

## 2021-04-21 DIAGNOSIS — Z7409 Other reduced mobility: Secondary | ICD-10-CM | POA: Diagnosis not present

## 2021-04-21 DIAGNOSIS — N390 Urinary tract infection, site not specified: Secondary | ICD-10-CM | POA: Diagnosis not present

## 2021-04-21 DIAGNOSIS — G4700 Insomnia, unspecified: Secondary | ICD-10-CM | POA: Diagnosis not present

## 2021-04-21 DIAGNOSIS — I129 Hypertensive chronic kidney disease with stage 1 through stage 4 chronic kidney disease, or unspecified chronic kidney disease: Secondary | ICD-10-CM | POA: Diagnosis not present

## 2021-06-19 IMAGING — DX DG CHEST 1V PORT
1 series · 1 of 1 positions shown · non-contrast
Comparison: October 16, 2016

CLINICAL DATA: Shortness of breath.

EXAM:
PORTABLE CHEST 1 VIEW

[chest ap]
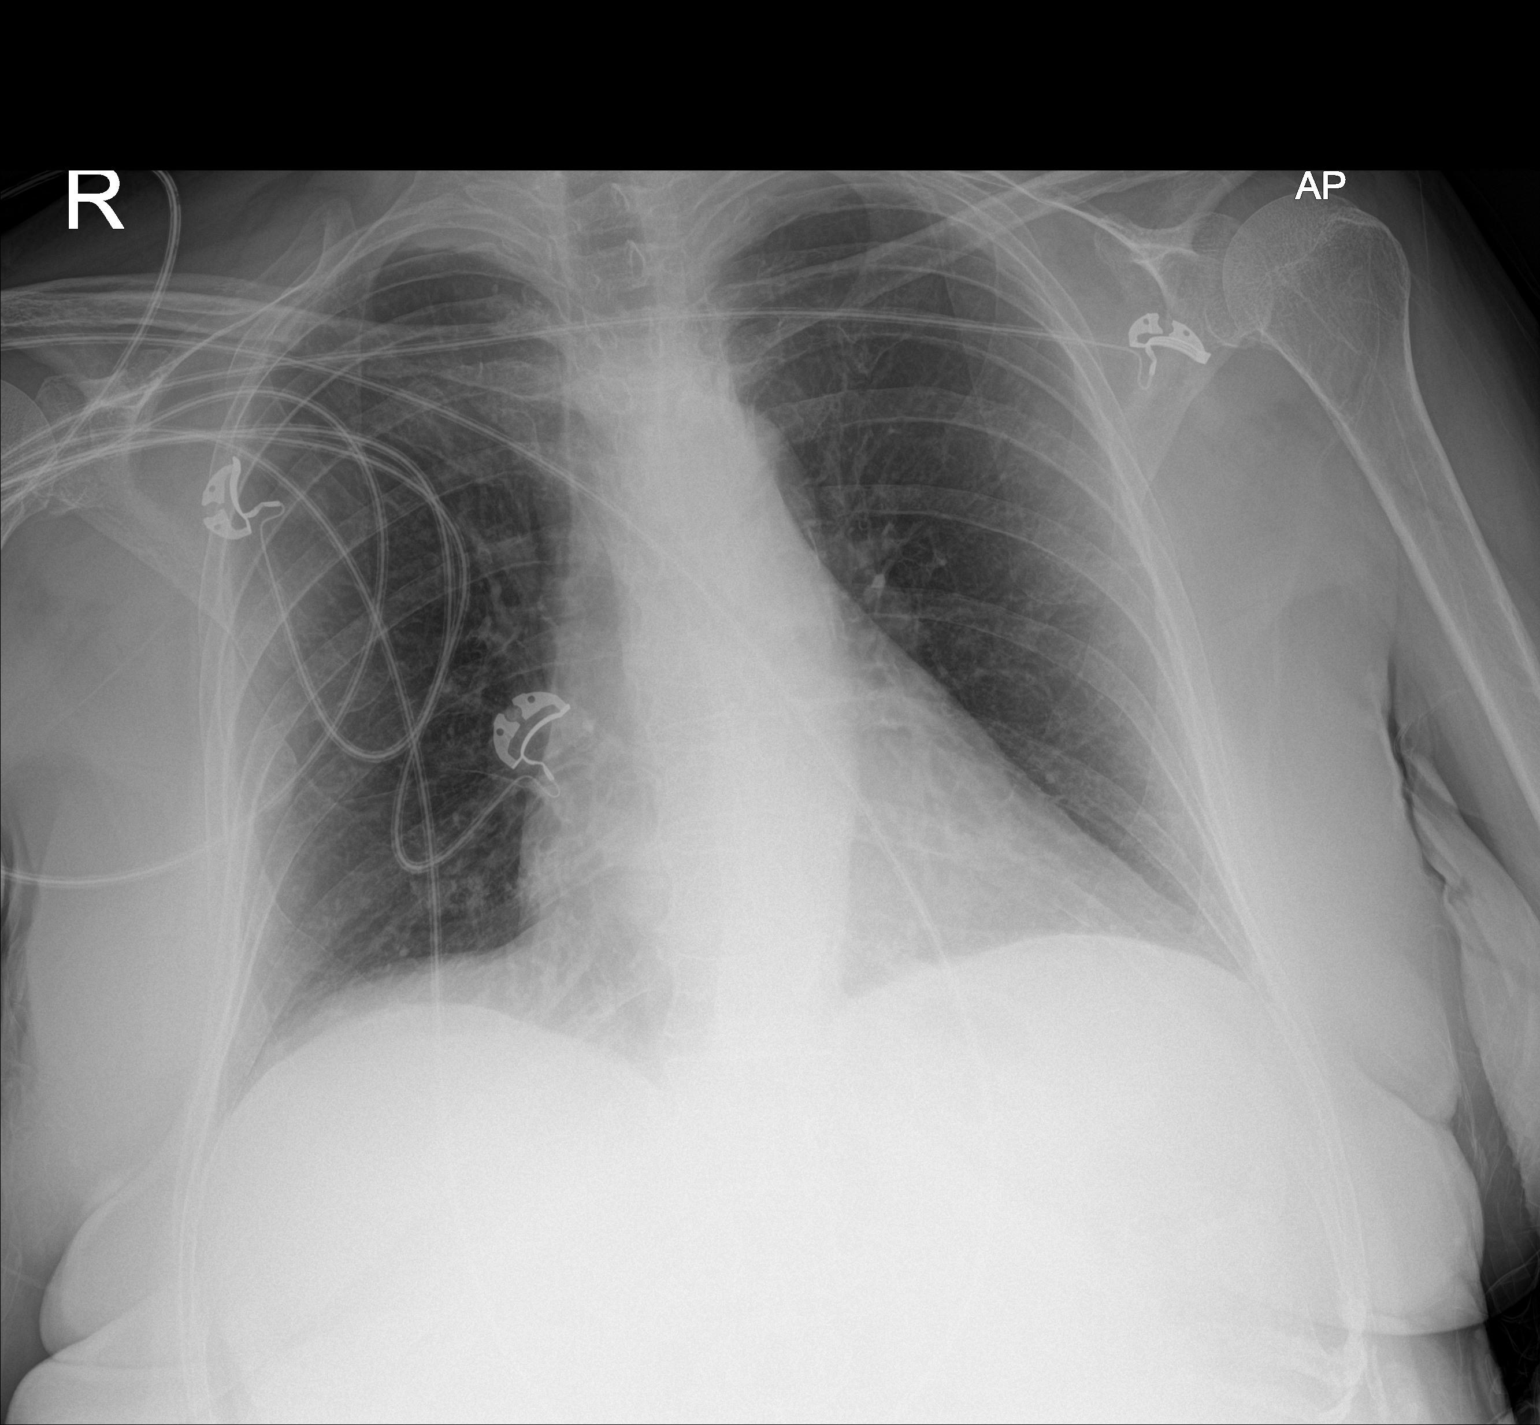

[1 of 1 positions shown; findings below may reference images not displayed]

FINDINGS: The heart size and mediastinal contours are within normal limits.
Both lungs are clear. Degenerative changes seen throughout the
thoracic spine.
IMPRESSION: No active disease.

## 2021-12-25 DEATH — deceased
